# Patient Record
Sex: Male | Born: 1960 | Hispanic: No | Marital: Single | Smoking: Never smoker
Health system: Southern US, Community
[De-identification: ages and names within clinical notes are randomized; demographics above are authoritative.]

## PROBLEM LIST (undated history)

## (undated) DIAGNOSIS — G8929 Other chronic pain: Secondary | ICD-10-CM

## (undated) DIAGNOSIS — R109 Unspecified abdominal pain: Secondary | ICD-10-CM

## (undated) DIAGNOSIS — I10 Essential (primary) hypertension: Secondary | ICD-10-CM

## (undated) DIAGNOSIS — R519 Headache, unspecified: Secondary | ICD-10-CM

## (undated) DIAGNOSIS — M542 Cervicalgia: Secondary | ICD-10-CM

## (undated) DIAGNOSIS — R51 Headache: Secondary | ICD-10-CM

## (undated) HISTORY — PX: TENDON REPAIR: SHX5111

---

## 2000-05-15 ENCOUNTER — Ambulatory Visit (HOSPITAL_COMMUNITY): Admission: RE | Admit: 2000-05-15 | Discharge: 2000-05-15 | Payer: Self-pay | Admitting: General Surgery

## 2000-05-15 ENCOUNTER — Encounter: Payer: Self-pay | Admitting: General Surgery

## 2001-10-28 ENCOUNTER — Ambulatory Visit (HOSPITAL_COMMUNITY): Admission: RE | Admit: 2001-10-28 | Discharge: 2001-10-28 | Payer: Self-pay | Admitting: Internal Medicine

## 2001-10-28 ENCOUNTER — Encounter: Payer: Self-pay | Admitting: Internal Medicine

## 2001-11-06 ENCOUNTER — Ambulatory Visit (HOSPITAL_COMMUNITY): Admission: RE | Admit: 2001-11-06 | Discharge: 2001-11-06 | Payer: Self-pay | Admitting: Internal Medicine

## 2001-11-06 ENCOUNTER — Encounter: Payer: Self-pay | Admitting: Internal Medicine

## 2002-05-16 ENCOUNTER — Emergency Department (HOSPITAL_COMMUNITY): Admission: EM | Admit: 2002-05-16 | Discharge: 2002-05-16 | Payer: Self-pay | Admitting: Emergency Medicine

## 2002-07-21 ENCOUNTER — Ambulatory Visit (HOSPITAL_COMMUNITY): Admission: RE | Admit: 2002-07-21 | Discharge: 2002-07-21 | Payer: Self-pay | Admitting: Family Medicine

## 2002-07-21 ENCOUNTER — Encounter: Payer: Self-pay | Admitting: Family Medicine

## 2002-09-15 ENCOUNTER — Ambulatory Visit (HOSPITAL_COMMUNITY): Admission: RE | Admit: 2002-09-15 | Discharge: 2002-09-15 | Payer: Self-pay | Admitting: Internal Medicine

## 2002-09-15 ENCOUNTER — Encounter: Payer: Self-pay | Admitting: Internal Medicine

## 2012-10-16 ENCOUNTER — Encounter (HOSPITAL_COMMUNITY): Payer: Self-pay

## 2012-10-16 ENCOUNTER — Emergency Department (HOSPITAL_COMMUNITY): Payer: Self-pay

## 2012-10-16 ENCOUNTER — Emergency Department (HOSPITAL_COMMUNITY)
Admission: EM | Admit: 2012-10-16 | Discharge: 2012-10-16 | Disposition: A | Discharge status: Home / Self Care | Payer: Self-pay | Attending: Emergency Medicine | Admitting: Emergency Medicine

## 2012-10-16 DIAGNOSIS — G510 Bell's palsy: Secondary | ICD-10-CM | POA: Insufficient documentation

## 2012-10-16 DIAGNOSIS — Z7982 Long term (current) use of aspirin: Secondary | ICD-10-CM | POA: Insufficient documentation

## 2012-10-16 DIAGNOSIS — G8929 Other chronic pain: Secondary | ICD-10-CM | POA: Insufficient documentation

## 2012-10-16 DIAGNOSIS — R51 Headache: Secondary | ICD-10-CM | POA: Insufficient documentation

## 2012-10-16 HISTORY — DX: Headache, unspecified: R51.9

## 2012-10-16 HISTORY — DX: Unspecified abdominal pain: R10.9

## 2012-10-16 HISTORY — DX: Headache: R51

## 2012-10-16 HISTORY — DX: Other chronic pain: G89.29

## 2012-10-16 HISTORY — DX: Cervicalgia: M54.2

## 2012-10-16 MED ORDER — ACYCLOVIR 400 MG PO TABS: 400.0000 mg | ORAL_TABLET | Freq: Every day | ORAL | Status: DC

## 2012-10-16 MED ORDER — PREDNISONE 20 MG PO TABS: 40.0000 mg | ORAL_TABLET | Freq: Every day | ORAL | Status: DC

## 2012-10-16 MED ORDER — PREDNISONE 50 MG PO TABS
60.0000 mg | ORAL_TABLET | Freq: Once | ORAL | Status: AC
  Administered 2012-10-16: 17:00:00 60 mg via ORAL
  Filled 2012-10-16: qty 1

## 2012-10-16 MED ORDER — ARTIFICIAL TEARS OP OINT: TOPICAL_OINTMENT | OPHTHALMIC | Status: DC | PRN

## 2012-10-16 NOTE — ED Provider Notes (Signed)
CSN: [id number]     Arrival date & time 10/16/12  1453 History   First MD Initiated Contact with Patient 10/16/12 1550     Chief Complaint  Patient presents with  . Numbness  . Headache    HPI Pt was seen at 1600. Per pt, c/o gradual onset and persistence of constant right sided facial "droop" for the past 3 to 4 days. Has been associated with inability to close his right eyelid and food "dropping" out of the right corner of his mouth. Denies any change in symptoms over the past 3 to 4 days. Denies visual changes, no dysphagia, no slurred speech, no focal motor weakness, no tingling/numbness in extremities, no fevers, no rash, no head/neck/face injury, no abd pain, no N/V/D, no CP/SOB.     Past Medical History  Diagnosis Date  . Chronic abdominal pain   . Chronic headaches   . Chronic neck pain    Past Surgical History  Procedure Laterality Date  . Tendon repair      left hand    History  Substance Use Topics  . Smoking status: Never Smoker   . Smokeless tobacco: Not on file  . Alcohol Use: No     Comment: former    Review of Systems ROS: Statement: All systems negative except as marked or noted in the HPI; Constitutional: Negative for fever and chills. ; ; Eyes: Negative for eye pain, redness and discharge. ; ; ENMT: Negative for ear pain, hoarseness, nasal congestion, sinus pressure and sore throat. ; ; Cardiovascular: Negative for chest pain, palpitations, diaphoresis, dyspnea and peripheral edema. ; ; Respiratory: Negative for cough, wheezing and stridor. ; ; Gastrointestinal: Negative for nausea, vomiting, diarrhea, abdominal pain, blood in stool, hematemesis, jaundice and rectal bleeding. . ; ; Genitourinary: Negative for dysuria, flank pain and hematuria. ; ; Musculoskeletal: Negative for back pain and neck pain. Negative for swelling and trauma.; ; Skin: Negative for pruritus, rash, abrasions, blisters, bruising and skin lesion.; ; Neuro: +right facial droop. Negative for  headache, lightheadedness and neck stiffness. Negative for weakness, altered level of consciousness , altered mental status, extremity weakness, paresthesias, involuntary movement, seizure and syncope.       Allergies  Review of patient's allergies indicates no known allergies.  Home Medications   Current Outpatient Rx  Name  Route  Sig  Dispense  Refill  . aspirin EC 81 MG tablet   Oral   Take 81 mg by mouth once.          BP 181/88  Pulse 77  Temp(Src) 97.9 F (36.6 C) (Oral)  Resp 20  Ht 5\' 3"  (1.6 m)  Wt 149 lb (67.586 kg)  BMI 26.4 kg/m2  SpO2 99% Physical Exam 1605: Physical examination:  Nursing notes reviewed; Vital signs and O2 SAT reviewed;  Constitutional: Well developed, Well nourished, Well hydrated, In no acute distress; Head:  Normocephalic, atraumatic; Eyes: EOMI, PERRL, No scleral icterus; ENMT: TM's clear bilat. Mouth and pharynx normal, Mucous membranes moist; Neck: Supple, Full range of motion, No lymphadenopathy; Cardiovascular: Regular rate and rhythm, No murmur, rub, or gallop; Respiratory: Breath sounds clear & equal bilaterally, No rales, rhonchi, wheezes.  Speaking full sentences with ease, Normal respiratory effort/excursion; Chest: Nontender, Movement normal; Abdomen: Soft, Nontender, Nondistended, Normal bowel sounds; Genitourinary: No CVA tenderness; Extremities: Pulses normal, No tenderness, No edema, No calf edema or asymmetry.; Neuro: AA&Ox3, Major CN grossly intact.  Strength 5/5 equal bilat UE's and LE's.  DTR 2/4 equal bilat UE's  and LE's.  No gross sensory deficits.  Normal cerebellar testing bilat UE's (finger-nose) and LE's (heel-shin). Speech clear. +entire right sided facial droop which includes right forehead.  No nystagmus. Climbs on and off stretcher by himself. Gait steady.;; Skin: Color normal, Warm, Dry.   ED Course  Procedures     MDM  MDM Reviewed: previous chart, nursing note and vitals Reviewed previous: CT scan and  MRI Interpretation: CT scan and ECG     Date: 10/16/2012  Rate: 80  Rhythm: normal sinus rhythm  QRS Axis: normal  Intervals: normal  ST/T Wave abnormalities: normal  Conduction Disutrbances:none  Narrative Interpretation:   Old EKG Reviewed: none available.   Ct Head Wo Contrast 10/16/2012   CLINICAL DATA:  The three-day headache with 2 day history of right facial droop  EXAM: CT HEAD WITHOUT CONTRAST  TECHNIQUE: Contiguous axial images were obtained from the base of the skull through the vertex without intravenous contrast. Study was obtained within 24 hr of patient's arrival at the emergency department.  COMPARISON:  None.  FINDINGS: The ventricles are normal in size and configuration. There is no mass, hemorrhage, extra-axial fluid collection, or midline shift. Gray-white compartments are normal. There is no demonstrable acute infarct.  Bony calvarium appears intact. The mastoid air cells are clear.  IMPRESSION: Study within normal limits.   Electronically Signed   By: Bretta Bang   On: 10/16/2012 16:23    1635:  CT negative for CVA after 3 to 4 days of constant symptoms. Wll tx for Bells palsy. Dx and testing d/w pt and family.  Questions answered.  Verb understanding, agreeable to d/c home with outpt f/u.       Laray Anger, DO 10/18/12 1252

## 2012-10-16 NOTE — ED Notes (Signed)
No other neuro deficits noted.  Pupils PERRLA.

## 2012-10-16 NOTE — ED Notes (Signed)
Pt reports headache for 3 days, right facial droop for 3 days. No slurred speech. No difficulty walking or gripping.

## 2013-11-04 ENCOUNTER — Encounter (HOSPITAL_COMMUNITY): Payer: Self-pay | Admitting: Emergency Medicine

## 2013-11-04 ENCOUNTER — Observation Stay (HOSPITAL_COMMUNITY)
Admission: EM | Admit: 2013-11-04 | Discharge: 2013-11-06 | Disposition: A | Discharge status: Home / Self Care | Payer: MEDICAID | Attending: Internal Medicine | Admitting: Internal Medicine

## 2013-11-04 ENCOUNTER — Emergency Department (HOSPITAL_COMMUNITY): Payer: MEDICAID

## 2013-11-04 DIAGNOSIS — R739 Hyperglycemia, unspecified: Secondary | ICD-10-CM

## 2013-11-04 DIAGNOSIS — R030 Elevated blood-pressure reading, without diagnosis of hypertension: Secondary | ICD-10-CM

## 2013-11-04 DIAGNOSIS — IMO0001 Reserved for inherently not codable concepts without codable children: Secondary | ICD-10-CM | POA: Diagnosis present

## 2013-11-04 DIAGNOSIS — M542 Cervicalgia: Secondary | ICD-10-CM | POA: Insufficient documentation

## 2013-11-04 DIAGNOSIS — E785 Hyperlipidemia, unspecified: Secondary | ICD-10-CM

## 2013-11-04 DIAGNOSIS — E119 Type 2 diabetes mellitus without complications: Secondary | ICD-10-CM

## 2013-11-04 DIAGNOSIS — E1165 Type 2 diabetes mellitus with hyperglycemia: Secondary | ICD-10-CM | POA: Insufficient documentation

## 2013-11-04 DIAGNOSIS — G459 Transient cerebral ischemic attack, unspecified: Principal | ICD-10-CM | POA: Diagnosis present

## 2013-11-04 DIAGNOSIS — G8929 Other chronic pain: Secondary | ICD-10-CM | POA: Insufficient documentation

## 2013-11-04 HISTORY — DX: Essential (primary) hypertension: I10

## 2013-11-04 LAB — COMPREHENSIVE METABOLIC PANEL
ALK PHOS: 106 U/L (ref 39–117)
ALT: 35 U/L (ref 0–53)
AST: 25 U/L (ref 0–37)
Albumin: 4.2 g/dL (ref 3.5–5.2)
Anion gap: 13 (ref 5–15)
BUN: 18 mg/dL (ref 6–23)
CO2: 27 meq/L (ref 19–32)
Calcium: 9.7 mg/dL (ref 8.4–10.5)
Chloride: 98 mEq/L (ref 96–112)
Creatinine, Ser: 0.77 mg/dL (ref 0.50–1.35)
GFR calc non Af Amer: >90 mL/min (ref >90)
GLUCOSE: 288 mg/dL — AB (ref 70–99)
POTASSIUM: 4.2 meq/L (ref 3.7–5.3)
SODIUM: 138 meq/L (ref 137–147)
TOTAL PROTEIN: 8.2 g/dL (ref 6.0–8.3)
Total Bilirubin: 0.8 mg/dL (ref 0.3–1.2)

## 2013-11-04 LAB — CBC WITH DIFFERENTIAL/PLATELET
Basophils Absolute: 0.1 10*3/uL (ref 0.0–0.1)
Basophils Relative: 1 % (ref 0–1)
EOS ABS: 0.2 10*3/uL (ref 0.0–0.7)
Eosinophils Relative: 2 % (ref 0–5)
HCT: 46.1 % (ref 39.0–52.0)
Hemoglobin: 16.4 g/dL (ref 13.0–17.0)
LYMPHS ABS: 2.2 10*3/uL (ref 0.7–4.0)
LYMPHS PCT: 26 % (ref 12–46)
MCH: 31.5 pg (ref 26.0–34.0)
MCHC: 35.6 g/dL (ref 30.0–36.0)
MCV: 88.5 fL (ref 78.0–100.0)
Monocytes Absolute: 0.5 10*3/uL (ref 0.1–1.0)
Monocytes Relative: 6 % (ref 3–12)
NEUTROS PCT: 65 % (ref 43–77)
Neutro Abs: 5.6 10*3/uL (ref 1.7–7.7)
PLATELETS: 290 10*3/uL (ref 150–400)
RBC: 5.21 MIL/uL (ref 4.22–5.81)
RDW: 12.3 % (ref 11.5–15.5)
WBC: 8.5 10*3/uL (ref 4.0–10.5)

## 2013-11-04 LAB — ETHANOL: Alcohol, Ethyl (B): <11 mg/dL (ref 0–11)

## 2013-11-04 NOTE — ED Notes (Signed)
Numbness that started today, first noticed it in my fingers on my left hand now it is in my left arm and going down my left leg per pt. Patient is also having pain in the back of his head.

## 2013-11-04 NOTE — ED Provider Notes (Addendum)
CSN: [id number]     Arrival date & time 11/04/13  2054 History  This chart was scribed for Flint MelterElliott L Kathleen Likins, MD by Bronson CurbJacqueline Melvin, ED Scribe. This patient was seen in room APA04/APA04 and the patient's care was started at 11:24 PM.    Chief Complaint  Patient presents with  . Numbness     The history is provided by the patient. No language interpreter was used.    HPI Comments: Jeff Powers is a 53 y.o. male who presents to the Emergency Department complaining of intermittent numbness to left arm and all fingers of his left hand onset this morning. 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 Patient reports a total of 2 episodes today, but was able to continue working throughout the day. He states that the first (around 10 AM) episode lasted for 5 minutes, before returning  several hours later (around 5 PM), and lasting for 5-7 minutes. There is associated neck pain. Patient also notes some numbness to his left leg, also intermittent, same time course as left hand. He denies HA, nausea, vomiting, dizziness, and weakness. Patient is currently not on BP medication at this time. There are no other known modifying factors.   Past Medical History  Diagnosis Date  . Chronic abdominal pain   . Chronic headaches   . Chronic neck pain    Past Surgical History  Procedure Laterality Date  . Tendon repair      left hand   History reviewed. No pertinent family history. History  Substance Use Topics  . Smoking status: Never Smoker   . Smokeless tobacco: Not on file  . Alcohol Use: No     Comment: former    Review of Systems  Musculoskeletal: Positive for neck pain.  Neurological: Positive for numbness (fingers of left hand, left arm, left leg).  All other systems reviewed and are negative.     Allergies  Review of patient's allergies indicates no known allergies.  Home Medications   Prior to Admission medications   Not on File   Triage Vitals: BP  177/90  Pulse 84  Temp(Src) 98.2 F (36.8 C) (Oral)  Resp 18  Ht 5\' 3"  (1.6 m)  Wt 148 lb (67.132 kg)  BMI 26.22 kg/m2  SpO2 96%  Physical Exam  Nursing note and vitals reviewed. Constitutional: He is oriented to person, place, and time. He appears well-developed and well-nourished.  HENT:  Head: Normocephalic and atraumatic.  Right Ear: External ear normal.  Left Ear: External ear normal.  Eyes: Conjunctivae and EOM are normal. Pupils are equal, round, and reactive to light.  Neck: Normal range of motion and phonation normal. Neck supple.  Cardiovascular: Normal rate, regular rhythm and normal heart sounds.   Pulmonary/Chest: Effort normal and breath sounds normal. He exhibits no bony tenderness.  Abdominal: Soft. There is no tenderness.  Musculoskeletal: Normal range of motion.  Neurological: He is alert and oriented to person, place, and time. No cranial nerve deficit or sensory deficit. He exhibits normal muscle tone. Coordination normal.  No pronator drift. No nystagmus, dysphasia, or dysarthria. No altered light touch sensation in the hands, arms or legs  Skin: Skin is warm, dry and intact.  Psychiatric: He has a normal mood and affect. His behavior is normal. Judgment and thought content normal.    ED Course  Procedures (including critical care time)  23:00- consideration for TPA; there is no indication to give thrombolytics at this time because symptoms  completely resolved, shortly after the last episode at 5 PM  DIAGNOSTIC STUDIES: Oxygen Saturation is 96% on room air, adequate by my interpretation.    COORDINATION OF CARE: At 2330 Discussed treatment plan with patient which includes labs. Patient agrees.   0100- consultation with neurology for evaluation of possible TIA. Will be seen by [facility] via telemetry, routinely.  04:00- Discussed with [facility] Neurology, recommends evaluation with MRI and w/u for TIA.  4:10 AM-Consult complete with Dr. Rito Ehrlich. Patient case  explained and discussed. He agrees to admit patient for further evaluation and treatment. Call ended at 0410  Labs Review Labs Reviewed  COMPREHENSIVE METABOLIC PANEL - Abnormal; Notable for the following:    Glucose, Bld 288 (*)    All other components within normal limits  URINALYSIS, ROUTINE W REFLEX MICROSCOPIC - Abnormal; Notable for the following:    Glucose, UA >1000 (*)    Ketones, ur TRACE (*)    Urobilinogen, UA 4.0 (*)    All other components within normal limits  CBC WITH DIFFERENTIAL  ETHANOL  PROTIME-INR  APTT  URINE RAPID DRUG SCREEN (HOSP PERFORMED)  URINE MICROSCOPIC-ADD ON    Imaging Review Ct Head Wo Contrast  11/04/2013   CLINICAL DATA:  Numbness which began earlier today in the fingers of the left hand, then advanced up the left arm and now involving the left leg also. Occipital headache.  EXAM: CT HEAD WITHOUT CONTRAST  TECHNIQUE: Contiguous axial images were obtained from the base of the skull through the vertex without intravenous contrast.  COMPARISON:  10/16/2012.  FINDINGS: Ventricular system normal in size and appearance for age. No mass lesion. No midline shift. No acute hemorrhage or hematoma. No extra-axial fluid collections. No evidence of acute infarction. No focal brain parenchymal abnormalities. No significant interval change.  No focal osseous abnormalities involving the skull. Visualized paranasal sinuses, bilateral mastoid air cells, and bilateral middle ear cavities well-aerated.  IMPRESSION: Normal and stable examination.   Electronically Signed   By: Hulan Saas M.D.   On: 11/04/2013 22:04     EKG Interpretation   Date/Time:  Tuesday November 04 2013 23:46:04 EDT Ventricular Rate:  69 PR Interval:  176 QRS Duration: 86 QT Interval:  380 QTC Calculation: 407 R Axis:   31 Text Interpretation:  Sinus rhythm ST elev, probable normal early repol  pattern since last tracing no significant change Confirmed by [facility]  MD,  Dreama Kuna ([id number]) on  11/05/2013 12:52:48 AM      MDM   Final diagnoses:  Transient cerebral ischemia, unspecified transient cerebral ischemia type  Hyperglycemia   Nonspecific paresthesias, intermittent, associated with mild, hypertension, untreated. Blood pressure improved spontaneously. No history similar symptoms. Incidental hyperglycemia on screening evaluation. No evidence of acute cardiac abnormality. Differential diagnosis includes peripheral neuropathy, TIA, hypertension; as possible sources of the discomfort.  Nursing Notes Reviewed/ Care Coordinated, and agree without changes. Applicable Imaging Reviewed.  Interpretation of Laboratory Data incorporated into ED treatment  Plan: Admit    I personally performed the services described in this documentation, which was scribed in my presence. The recorded information has been reviewed and is accurate.    Flint Melter, MD 11/05/13 0425  Flint Melter, MD 11/05/13 [id number]

## 2013-11-04 NOTE — ED Notes (Signed)
Pt reports that this morning he began feeling tingling in last two fingers on left hand.  Also c/o mild pain in left side of neck.

## 2013-11-05 ENCOUNTER — Observation Stay (HOSPITAL_COMMUNITY): Payer: MEDICAID

## 2013-11-05 ENCOUNTER — Observation Stay (HOSPITAL_COMMUNITY)
Admission: EM | Admit: 2013-11-05 | Discharge: 2013-11-05 | Disposition: A | Discharge status: Home / Self Care | Payer: MEDICAID | Source: Home / Self Care | Attending: Internal Medicine | Admitting: Internal Medicine

## 2013-11-05 ENCOUNTER — Encounter (HOSPITAL_COMMUNITY): Payer: Self-pay | Admitting: Internal Medicine

## 2013-11-05 DIAGNOSIS — IMO0001 Reserved for inherently not codable concepts without codable children: Secondary | ICD-10-CM | POA: Diagnosis present

## 2013-11-05 DIAGNOSIS — G459 Transient cerebral ischemic attack, unspecified: Secondary | ICD-10-CM | POA: Diagnosis present

## 2013-11-05 DIAGNOSIS — R739 Hyperglycemia, unspecified: Secondary | ICD-10-CM

## 2013-11-05 DIAGNOSIS — I517 Cardiomegaly: Secondary | ICD-10-CM

## 2013-11-05 DIAGNOSIS — R030 Elevated blood-pressure reading, without diagnosis of hypertension: Secondary | ICD-10-CM

## 2013-11-05 LAB — GLUCOSE, CAPILLARY
GLUCOSE-CAPILLARY: 221 mg/dL — AB (ref 70–99)
Glucose-Capillary: 169 mg/dL — ABNORMAL HIGH (ref 70–99)
Glucose-Capillary: 197 mg/dL — ABNORMAL HIGH (ref 70–99)
Glucose-Capillary: 267 mg/dL — ABNORMAL HIGH (ref 70–99)

## 2013-11-05 LAB — PROTIME-INR
INR: 1.02 (ref 0.00–1.49)
Prothrombin Time: 13.4 seconds (ref 11.6–15.2)

## 2013-11-05 LAB — URINALYSIS, ROUTINE W REFLEX MICROSCOPIC
BILIRUBIN URINE: NEGATIVE
HGB URINE DIPSTICK: NEGATIVE
Leukocytes, UA: NEGATIVE
Nitrite: NEGATIVE
PH: 7 (ref 5.0–8.0)
Protein, ur: NEGATIVE mg/dL
Specific Gravity, Urine: 1.015 (ref 1.005–1.030)
Urobilinogen, UA: 4 mg/dL — ABNORMAL HIGH (ref 0.0–1.0)

## 2013-11-05 LAB — LIPID PANEL
CHOL/HDL RATIO: 3.7 ratio
Cholesterol: 185 mg/dL (ref 0–200)
HDL: 50 mg/dL (ref >39)
LDL Cholesterol: 106 mg/dL — ABNORMAL HIGH (ref 0–99)
TRIGLYCERIDES: 147 mg/dL (ref <150)
VLDL: 29 mg/dL (ref 0–40)

## 2013-11-05 LAB — HEMOGLOBIN A1C
Hgb A1c MFr Bld: 8.6 % — ABNORMAL HIGH (ref <5.7)
Mean Plasma Glucose: 200 mg/dL — ABNORMAL HIGH (ref <117)

## 2013-11-05 LAB — APTT: aPTT: 30 seconds (ref 24–37)

## 2013-11-05 LAB — RAPID URINE DRUG SCREEN, HOSP PERFORMED
Amphetamines: NOT DETECTED
BARBITURATES: NOT DETECTED
BENZODIAZEPINES: NOT DETECTED
COCAINE: NOT DETECTED
OPIATES: NOT DETECTED
Tetrahydrocannabinol: NOT DETECTED

## 2013-11-05 LAB — MRSA PCR SCREENING: MRSA by PCR: INVALID — AB

## 2013-11-05 LAB — URINE MICROSCOPIC-ADD ON: Urine-Other: NONE SEEN

## 2013-11-05 MED ORDER — STROKE: EARLY STAGES OF RECOVERY BOOK
Freq: Once | Status: AC
  Administered 2013-11-05: 10:00:00
  Filled 2013-11-05: qty 1

## 2013-11-05 MED ORDER — ASPIRIN 325 MG PO TABS
325.0000 mg | ORAL_TABLET | Freq: Every day | ORAL | Status: DC
  Administered 2013-11-05 – 2013-11-06 (×2): 325 mg via ORAL
  Filled 2013-11-05 (×2): qty 1

## 2013-11-05 MED ORDER — ACETAMINOPHEN 325 MG PO TABS: 650.0000 mg | ORAL_TABLET | ORAL | Status: DC | PRN

## 2013-11-05 MED ORDER — SIMVASTATIN 20 MG PO TABS
20.0000 mg | ORAL_TABLET | Freq: Every day | ORAL | Status: DC
  Administered 2013-11-05: 20 mg via ORAL
  Filled 2013-11-05: qty 1

## 2013-11-05 MED ORDER — SODIUM CHLORIDE 0.9 % IJ SOLN: 3.0000 mL | INTRAMUSCULAR | Status: DC | PRN

## 2013-11-05 MED ORDER — INSULIN ASPART 100 UNIT/ML ~~LOC~~ SOLN
0.0000 [IU] | Freq: Three times a day (TID) | SUBCUTANEOUS | Status: DC
  Administered 2013-11-05: 2 [IU] via SUBCUTANEOUS
  Administered 2013-11-06 (×2): 3 [IU] via SUBCUTANEOUS

## 2013-11-05 MED ORDER — ONDANSETRON HCL 4 MG/2ML IJ SOLN
4.0000 mg | Freq: Four times a day (QID) | INTRAMUSCULAR | Status: DC | PRN
Start: 2013-11-05 — End: 2013-11-06

## 2013-11-05 MED ORDER — ENOXAPARIN SODIUM 40 MG/0.4ML ~~LOC~~ SOLN
40.0000 mg | SUBCUTANEOUS | Status: DC
  Administered 2013-11-05 – 2013-11-06 (×2): 40 mg via SUBCUTANEOUS
  Filled 2013-11-05 (×2): qty 0.4

## 2013-11-05 MED ORDER — SODIUM CHLORIDE 0.9 % IJ SOLN
3.0000 mL | Freq: Two times a day (BID) | INTRAMUSCULAR | Status: DC
  Administered 2013-11-05 (×2): 3 mL via INTRAVENOUS

## 2013-11-05 MED ORDER — SODIUM CHLORIDE 0.9 % IV SOLN: 250.0000 mL | INTRAVENOUS | Status: DC | PRN

## 2013-11-05 NOTE — Progress Notes (Signed)
   [facility]                                                                              Patient Demographics  Jeff Powers, is a 53 y.o. male, DOB - 04/19/1960, ZOX:[id number]  Admit date - 11/04/2013   Admitting Physician Osvaldo ShipperGokul Krishnan, MD  Outpatient Primary MD for the patient is No PCP Per Patient  LOS - 1   Chief Complaint  Patient presents with  . Numbness      HPI on 11/05/2013 Jeff Powers is a [age] y.o. 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 At 9:00 am 11/04/2013 he experienced some tingling in the left arm starting in the fingers extending up to the shoulder area. It lasted a few minutes and then resolved. And, then this recurred again, at 5 PM and this time he also had numbness in his left leg. Unclear if he was weak on that side. Denied any speech impairment. No difficulty swallowing. No seizure activity. No visual impairment. No headaches. He did have some neck pain, earlier yesterday. He had Bell's palsy last year. He stated that he has never had a stroke. He has not taken any blood pressure medicine in many years despite being diagnosed [place] many years [place]. Since the symptoms recurred he decided to seek attention.   Assessment & Plan   Patient was admitted early this morning, agree with current assessment and plan done by Dr. Osvaldo ShipperGokul Krishnan.  Left-sided numbness and tingling, evaluate for TIA -CT of the head: Normal and stable examination -Pending MRI of the brain as well as MRI of the head, MRI of the cervical spine, echocardiogram, carotid Dopplers, hemoglobin A1c -LDL is 106 -PT and OT have also been consulted for evaluation and treatment -Patient currently is not having any symptoms of numbness or tingling -Will place patient on aspirin start him on low dose statin  Hyperglycemia -Patient does not have a  history of diabetes, pending hemoglobin A1c -Will place him on insulin sliding scale with CBG monitoring -May start him on metformin pending his hemoglobin A1c  Hypertension -Patient states he does not take any medications, however did this several years ago -Will of a permissive hypertension however patient will likely need to be discharged with antihypertensive is  Code Status: Full  Family Communication: Friend at bedside  Disposition Plan: Admitted for observation, pending TIA workup  Time Spent in minutes   30 minutes  Procedures  None  Consults   None  DVT Prophylaxis  Lovenox  Dorthy Hustead D.O. on 11/05/2013 at 10:39 AM  Between 7am to 7pm - Pager - [phone]  After 7pm go to [email or web address] - password [email or web address]  And look for the night coverage person covering for me after hours  [facility] Office  [phone]

## 2013-11-05 NOTE — Progress Notes (Signed)
Inpatient Diabetes Program Recommendations  AACE/ADA: New Consensus Statement on Inpatient Glycemic Control (2013)  Target Ranges:  Prepandial:   less than 140 mg/dL      Peak postprandial:   less than 180 mg/dL (1-2 hours)      Critically ill patients:  140 - 180 mg/dL   Results for [name], Jeff Powers (MRN [id number]) as of 11/05/2013 07:33  Ref. Range 11/04/2013 21:20  Glucose Latest Range: 70-99 mg/dL 409288 (H)   Diabetes history: NO Outpatient Diabetes medications: NA Current orders for Inpatient glycemic control: None  Inpatient Diabetes Program Recommendations Correction (SSI): Please consider ordering CBGs with Novolog correction if needed.  Noted A1C has been ordered an is in process.   Thanks, Orlando PennerMarie Adriaan Maltese, RN, MSN, CCRN Diabetes Coordinator Inpatient Diabetes Program [phone] (Team Pager) [phone] ([facility] office) [phone] [facility] office)

## 2013-11-05 NOTE — Progress Notes (Signed)
OT Cancellation Note  Patient Details Name: Jeff Powers MRN: [id number] DOB: 04/20/1960   Cancelled Treatment:    Reason Eval/Treat Not Completed: OT screened, no needs identified, will sign off.  Pt has WFL strength, ROM, and sensation. Pt has no concerns about symptoms or ADLs at home at d/c.  Pt does not need OT services at this time.    Jeff GuanMarie Rawlings Kamari Bilek, MS, OTR/L [facility] [phone] 11/05/2013, 8:40 AM

## 2013-11-05 NOTE — H&P (Signed)
[facility] History and Physical  [name] KDX:[id number] DOB: 1960/09/06 DOA: 11/04/2013   PCP: He states that he sees a physician across the street from this hospital, but is unable to name this provider Specialists: None  Chief Complaint: Tingling, numbness in the left side  HPI: Jeff Powers is a 53 y.o. 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 At 9:00 yesterday morning he experienced some tingling in the left arm starting in the fingers extending up to the shoulder area. It lasted a few minutes and then resolved. And, then this recurred again, at 5 PM and this time he also had numbness in his left leg. Unclear if he was weak on that side. Denies any speech impairment. No difficulty swallowing. No seizure activity. No visual impairment. No headaches. He did have some neck pain, earlier yesterday. He had Bell's palsy last year. He states that he has never had a stroke. Hasn't taken any blood pressure medicine in many years despite being diagnosed with Hypertension many years ago in [place]. Since the symptoms recurred he decided to seek attention.  Home Medications: Prior to Admission medications   Not on File    Allergies: No Known Allergies  Past Medical History: Past Medical History  Diagnosis Date  . Chronic abdominal pain   . Chronic headaches   . Chronic neck pain     Past Surgical History  Procedure Laterality Date  . Tendon repair      left hand    Social History: He lives in [place]. No smoking, alcohol use or illicit drug use. 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  Family History:  he denies any health problems in his family  Review of Systems - History obtained from the patient General ROS: negative Psychological ROS: negative Ophthalmic ROS: negative ENT ROS: negative Allergy  and Immunology ROS: negative Hematological and Lymphatic ROS: negative Endocrine ROS: negative Respiratory ROS: no cough, shortness of breath, or wheezing Cardiovascular ROS: no chest pain or dyspnea on exertion Gastrointestinal ROS: no abdominal pain, change in bowel habits, or black or bloody stools Genito-Urinary ROS: no dysuria, trouble voiding, or hematuria Musculoskeletal ROS: negative Neurological ROS: as in hpi Dermatological ROS: negative  Physical Examination  Filed Vitals:   11/04/13 2106 11/04/13 2258 11/05/13 0409  BP: 177/90 144/85 149/81  Pulse: 84 68 57  Temp: 98.2 F (36.8 C) 97.7 F (36.5 C) 97.8 F (36.6 C)  TempSrc: Oral Oral Oral  Resp: 18 18 16   Height: 5' 3"  (1.6 m)    Weight: 67.132 kg (148 lb)    SpO2: 96% 97% 97%    BP 149/81  Pulse 57  Temp(Src) 97.8 F (36.6 C) (Oral)  Resp 16  Ht 5' 3"  (1.6 m)  Wt 67.132 kg (148 lb)  BMI 26.22 kg/m2  SpO2 97%  General appearance: alert, cooperative, appears stated age and no distress Head: Normocephalic, without obvious abnormality, atraumatic Eyes: conjunctivae/corneas clear. PERRL, EOM's intact. Throat: lips, mucosa, and tongue normal; teeth and gums normal Neck: no adenopathy, no carotid bruit, no JVD, supple, symmetrical, trachea midline and thyroid not enlarged, symmetric, no tenderness/mass/nodules Resp: clear to auscultation bilaterally Cardio: regular rate and rhythm, S1, S2 normal, no murmur, click, rub or gallop GI: soft, non-tender; bowel sounds normal; no masses,  no organomegaly Extremities: extremities normal, atraumatic, no cyanosis or edema  Pulses: 2+ and symmetric Skin: Skin color, texture, turgor normal. No rashes or lesions Lymph nodes: Cervical, supraclavicular, and axillary nodes normal. Neurologic: Alert and oriented X 3, normal strength and tone. Normal symmetric reflexes. Normal coordination  Laboratory Data: Results for orders placed during the hospital encounter of 11/04/13  (from the past 48 hour(s))  CBC WITH DIFFERENTIAL     Status: None   Collection Time    11/04/13  9:20 PM      Result Value Ref Range   WBC 8.5  4.0 - 10.5 K/uL   RBC 5.21  4.22 - 5.81 MIL/uL   Hemoglobin 16.4  13.0 - 17.0 g/dL   HCT 46.1  39.0 - 52.0 %   MCV 88.5  78.0 - 100.0 fL   MCH 31.5  26.0 - 34.0 pg   MCHC 35.6  30.0 - 36.0 g/dL   RDW 12.3  11.5 - 15.5 %   Platelets 290  150 - 400 K/uL   Neutrophils Relative % 65  43 - 77 %   Neutro Abs 5.6  1.7 - 7.7 K/uL   Lymphocytes Relative 26  12 - 46 %   Lymphs Abs 2.2  0.7 - 4.0 K/uL   Monocytes Relative 6  3 - 12 %   Monocytes Absolute 0.5  0.1 - 1.0 K/uL   Eosinophils Relative 2  0 - 5 %   Eosinophils Absolute 0.2  0.0 - 0.7 K/uL   Basophils Relative 1  0 - 1 %   Basophils Absolute 0.1  0.0 - 0.1 K/uL  COMPREHENSIVE METABOLIC PANEL     Status: Abnormal   Collection Time    11/04/13  9:20 PM      Result Value Ref Range   Sodium 138  137 - 147 mEq/L   Potassium 4.2  3.7 - 5.3 mEq/L   Chloride 98  96 - 112 mEq/L   CO2 27  19 - 32 mEq/L   Glucose, Bld 288 (*) 70 - 99 mg/dL   BUN 18  6 - 23 mg/dL   Creatinine, Ser 0.77  0.50 - 1.35 mg/dL   Calcium 9.7  8.4 - 10.5 mg/dL   Total Protein 8.2  6.0 - 8.3 g/dL   Albumin 4.2  3.5 - 5.2 g/dL   AST 25  0 - 37 U/L   ALT 35  0 - 53 U/L   Alkaline Phosphatase 106  39 - 117 U/L   Total Bilirubin 0.8  0.3 - 1.2 mg/dL   GFR calc non Af Amer >90  >90 mL/min   GFR calc Af Amer >90  >90 mL/min   Comment: (NOTE)     The eGFR has been calculated using the CKD EPI equation.     This calculation has not been validated in all clinical situations.     eGFR's persistently <90 mL/min signify possible Chronic Kidney     Disease.   Anion gap 13  5 - 15  ETHANOL     Status: None   Collection Time    11/04/13  9:20 PM      Result Value Ref Range   Alcohol, Ethyl (B) <11  0 - 11 mg/dL   Comment:            LOWEST DETECTABLE LIMIT FOR     SERUM ALCOHOL IS 11 mg/dL     FOR MEDICAL PURPOSES ONLY    PROTIME-INR     Status: None   Collection Time    11/04/13  9:20 PM      Result Value Ref Range   Prothrombin Time 13.4  11.6 - 15.2 seconds   INR 1.02  0.00 - 1.49  APTT     Status: None   Collection Time    11/04/13  9:20 PM      Result Value Ref Range   aPTT 30  24 - 37 seconds  URINE RAPID DRUG SCREEN (HOSP PERFORMED)     Status: None   Collection Time    11/04/13 11:50 PM      Result Value Ref Range   Opiates NONE DETECTED  NONE DETECTED   Cocaine NONE DETECTED  NONE DETECTED   Benzodiazepines NONE DETECTED  NONE DETECTED   Amphetamines NONE DETECTED  NONE DETECTED   Tetrahydrocannabinol NONE DETECTED  NONE DETECTED   Barbiturates NONE DETECTED  NONE DETECTED   Comment:            DRUG SCREEN FOR MEDICAL PURPOSES     ONLY.  IF CONFIRMATION IS NEEDED     FOR ANY PURPOSE, NOTIFY LAB     WITHIN 5 DAYS.                LOWEST DETECTABLE LIMITS     FOR URINE DRUG SCREEN     Drug Class       Cutoff (ng/mL)     Amphetamine      1000     Barbiturate      200     Benzodiazepine   646     Tricyclics       803     Opiates          300     Cocaine          300     THC              50  URINALYSIS, ROUTINE W REFLEX MICROSCOPIC     Status: Abnormal   Collection Time    11/04/13 11:50 PM      Result Value Ref Range   Color, Urine YELLOW  YELLOW   APPearance CLEAR  CLEAR   Specific Gravity, Urine 1.015  1.005 - 1.030   pH 7.0  5.0 - 8.0   Glucose, UA >1000 (*) NEGATIVE mg/dL   Hgb urine dipstick NEGATIVE  NEGATIVE   Bilirubin Urine NEGATIVE  NEGATIVE   Ketones, ur TRACE (*) NEGATIVE mg/dL   Protein, ur NEGATIVE  NEGATIVE mg/dL   Urobilinogen, UA 4.0 (*) 0.0 - 1.0 mg/dL   Nitrite NEGATIVE  NEGATIVE   Leukocytes, UA NEGATIVE  NEGATIVE  URINE MICROSCOPIC-ADD ON     Status: None   Collection Time    11/04/13 11:50 PM      Result Value Ref Range   Urine-Other       Value: NO FORMED ELEMENTS SEEN ON URINE MICROSCOPIC EXAMINATION    Radiology Reports: Ct Head Wo  Contrast  11/04/2013   CLINICAL DATA:  Numbness which began earlier today in the fingers of the left hand, then advanced up the left arm and now involving the left leg also. Occipital headache.  EXAM: CT HEAD WITHOUT CONTRAST  TECHNIQUE: Contiguous axial images were obtained from the base of the skull through the vertex without intravenous contrast.  COMPARISON:  10/16/2012.  FINDINGS: Ventricular system normal in size and appearance for age. No mass lesion. No midline shift. No acute hemorrhage or hematoma. No extra-axial fluid collections. No evidence of acute infarction. No focal brain  parenchymal abnormalities. No significant interval change.  No focal osseous abnormalities involving the skull. Visualized paranasal sinuses, bilateral mastoid air cells, and bilateral middle ear cavities well-aerated.  IMPRESSION: Normal and stable examination.   Electronically Signed   By: Evangeline Dakin M.D.   On: 11/04/2013 22:04    Electrocardiogram: Sinus rhythm at 69 beats per minute. Normal axis. Intervals are normal. No concerning ST or T-wave changes are noted.  Problem List  Principal Problem:   TIA (transient ischemic attack) Active Problems:   Hyperglycemia   Elevated blood pressure   Assessment: This is a 53 year old, Hispanic male, who presents with left-sided numbness, tingling, which resolved after a few minutes. Concern is about a transient ischemic attack. Cervical radiculopathy could be another differential. Epileptic activity is another differential.  Plan: #1 left-sided tingling and numbness/TIA: He has been seen by Teleneurology, and they are recommending TIA workup as per the ED physician. We will observe him in the hospital. We will order MRI of the brain, MRA head. Will also get MRI of the cervical spine. Place him on aspirin. PT/OT will be consulted. Lipid panel will be checked. Echocardiogram and carotid Dopplers will be obtained.  #2 hyperglycemia: Check HbA1c in the morning and a  fasting level. He likely has diabetes. Definitive management will need to be discussed with him. Metformin could be a good choice.  #3 elevated blood pressure with a previous history of hypertension: Patient does not take any blood pressure medications at home any longer. Monitor blood pressure closely. Consider antihypertensive agent if blood pressure remains elevated.  DVT Prophylaxis: Lovenox Code Status: Full code Family Communication: Discussed with the patient  Disposition Plan: Observe the telemetry   Further management decisions will depend on results of further testing and patient's response to treatment.   [facility]  [facility] Pager [phone]  If 7PM-7AM, please contact night-coverage [email or web address] Password [email or web address]  11/05/2013, 4:25 AM

## 2013-11-05 NOTE — Progress Notes (Signed)
  Echocardiogram 2D Echocardiogram has been performed.  Jeff Powers 11/05/2013, 9:22 AM

## 2013-11-05 NOTE — Progress Notes (Signed)
Called report to Darvin Neighbourshristina Knight, RN.  Verbalized understanding.  Pt transferred to rm 320 in safe and stable condition. Schonewitz, Candelaria StagersLeigh Anne 11/05/2013

## 2013-11-05 NOTE — Progress Notes (Signed)
EEG Completed; Results Pending  

## 2013-11-05 NOTE — ED Notes (Signed)
Teleneurology consult completed  

## 2013-11-05 NOTE — Progress Notes (Signed)
PT Cancellation Note  Patient Details Name: Jeff AlmondCiriaco T Canizalez MRN: [id number] DOB: 04/04/1960   Cancelled Treatment:    Reason Eval/Treat Not Completed: PT screened, no needs identified, will sign off  Received order and chart reviewed.  Per RN, [facility] feeding self and not displaying any difficulties with functional mobility skills.  Pt screened and (I) with bed mobility skills, transfers, and amb skills in room.  No needs identified at this time; pt to be d/c from acute PT services.    Chanae Gemma 11/05/2013, 8:40 AM

## 2013-11-06 DIAGNOSIS — E785 Hyperlipidemia, unspecified: Secondary | ICD-10-CM

## 2013-11-06 DIAGNOSIS — M542 Cervicalgia: Secondary | ICD-10-CM

## 2013-11-06 DIAGNOSIS — E119 Type 2 diabetes mellitus without complications: Secondary | ICD-10-CM

## 2013-11-06 LAB — GLUCOSE, CAPILLARY
GLUCOSE-CAPILLARY: 233 mg/dL — AB (ref 70–99)
Glucose-Capillary: 206 mg/dL — ABNORMAL HIGH (ref 70–99)

## 2013-11-06 MED ORDER — LIVING WELL WITH DIABETES BOOK - IN SPANISH
Freq: Once | Status: AC
  Administered 2013-11-06: 10:00:00
  Filled 2013-11-06: qty 1

## 2013-11-06 MED ORDER — METFORMIN HCL 500 MG PO TABS: 500.0000 mg | ORAL_TABLET | Freq: Two times a day (BID) | ORAL | Status: AC

## 2013-11-06 MED ORDER — ASPIRIN EC 81 MG PO TBEC: 81.0000 mg | DELAYED_RELEASE_TABLET | Freq: Every day | ORAL | Status: AC

## 2013-11-06 MED ORDER — SIMVASTATIN 20 MG PO TABS: 20.0000 mg | ORAL_TABLET | Freq: Every day | ORAL | Status: AC

## 2013-11-06 NOTE — Progress Notes (Signed)
Inpatient Diabetes Program Recommendations  AACE/ADA: New Consensus Statement on Inpatient Glycemic Control (2013)  Target Ranges:  Prepandial:   less than 140 mg/dL      Peak postprandial:   less than 180 mg/dL (1-2 hours)      Critically ill patients:  140 - 180 mg/dL   Results for [name], Jeff T (MRN [id number]) as of 11/06/2013 09:01  Ref. Range 11/05/2013 07:21 11/05/2013 11:23 11/05/2013 16:56 11/05/2013 20:53 11/06/2013 07:29  Glucose-Capillary Latest Range: 70-99 mg/dL 409169 (H) 811221 (H) 914197 (H) 267 (H) 206 (H)   Diabetes history: No Outpatient Diabetes medications: NA Current orders for Inpatient glycemic control: Novolog 0-9 units AC  Inpatient Diabetes Program Recommendations Correction (SSI): Please increase Novolog correction to moderate scale and add Bedtime correction scale.  Oral Agents: Noted patient will likely be started on Metformin if A1C indicative of diabetes. HgbA1C: A1C is 8.6% on 11/05/13 which meets ADA criteria for diabetes diagnosis. Please inform patient of dx and let staff know so that patient can be educated prior to discharge. Diet: Please change diet to Heart Healthy Carb Modified diet.   Thanks, Jeff PennerMarie Hyacinth Marcelli, RN, MSN, CCRN Diabetes Coordinator Inpatient Diabetes Program [phone] (Team Pager) [phone] (AP office) [phone] [facility] office)

## 2013-11-06 NOTE — Care Management Note (Signed)
    Page 1 of 1   11/06/2013     3:08:22 PM CARE MANAGEMENT NOTE 11/06/2013  Patient:  Jeff Powers,Jeff Powers   Account Number:  [id number]  Date Initiated:  11/06/2013  Documentation initiated by:  Anibal HendersonBOLDEN,Clifton Safley  Subjective/Objective Assessment:   Admitted with numbness in leg and arm.  He is from home, alone, and is independent. He will return home at D/C.     Action/Plan:   [facility]   Anticipated DC Date:  11/06/2013   Anticipated DC Plan:  HOME/SELF CARE      DC Planning Services  CM consult      Choice offered to / List presented to:             Status of service:  Completed, signed off Medicare Important Message given?   (If response is "NO", the following Medicare IM given date fields will be blank) Date Medicare IM given:   Medicare IM given by:   Date Additional Medicare IM given:   Additional Medicare IM given by:    Discharge Disposition:  HOME/SELF CARE  Per UR Regulation:  Reviewed for med. necessity/level of care/duration of stay  If discussed at Long Length of Stay Meetings, dates discussed:    Comments:  11/06/13 1500 Anibal HendersonGeneva Anaisabel Pederson RN/CM

## 2013-11-06 NOTE — Progress Notes (Signed)
Patient with orders to be discharge home. Discharge instructions given, patient verbalized understanding. Diabetes education given. Prescriptions given. Patient stable. Patient left in private vehicle with family.

## 2013-11-06 NOTE — Procedures (Signed)
  [facility] Wanza Szumski A. Gerilyn Pilgrimoonquah, MD     [email or web address]           HISTORY: This is a 53 year old man who Presents wit an episode of confusion associated with tingling and numbness of the left side. Spell is suspicious for seizures.  MEDICATIONS: Scheduled Meds: Continuous Infusions: PRN Meds:.    Prior to Admission medications   Medication Sig Start Date End Date Taking? Authorizing Provider  aspirin EC 81 MG tablet Take 1 tablet (81 mg total) by mouth daily. 11/06/13   Maryann Mikhail, DO  metFORMIN (GLUCOPHAGE) 500 MG tablet Take 1 tablet (500 mg total) by mouth 2 (two) times daily with a meal. 11/06/13   Maryann Mikhail, DO  simvastatin (ZOCOR) 20 MG tablet Take 1 tablet (20 mg total) by mouth daily at 6 PM. 11/06/13   Maryann Mikhail, DO      ANALYSIS: A 16 channel recording using standard 10 20 measurements is conducted for 20 minutes. There is a low voltage electrocortical activity of 18 Hz which attenuates with eye opening. Awake and drowsy activities are observed. Photic simulation and hyperventilation were not carried out. There are no focal slowing or lateralized slowing observed. There are no Epileptiform activities observed.   IMPRESSION: 1. This is a normal recording of the awake and drowsy states.      Laryn Venning A. Gerilyn Pilgrimoonquah, M.D.  Diplomate, Biomedical engineerAmerican Board of Psychiatry and Neurology ( Neurology).

## 2013-11-06 NOTE — Discharge Instructions (Signed)
Diabetes mellitus tipo2 (Type 2 Diabetes Mellitus) La diabetes mellitus tipo2, generalmente denominada diabetes tipo2, es una enfermedad prolongada (crnica). En la diabetes tipo2, el pncreas no produce suficiente insulina (una hormona), las clulas son menos sensibles a la insulina que se produce (resistencia a la insulina), o ambos. Normalmente, la Johnson Controlsinsulina mueve los azcares de los alimentos a las clulas de los tejidos. Las clulas de los tejidos Cendant Corporationutilizan los azcares para Psychiatristobtener energa. La falta de insulina o la falta de una respuesta normal a la insulina hace que el exceso de azcar se acumule en la sangre en lugar de Customer service managerpenetrar en las clulas de los tejidos. Como resultado, se producen niveles altos de Bankerazcar en la sangre (hiperglucemia). El [address]fecto de los niveles altos de azcar (glucosa) puede causar muchas complicaciones.  La diabetes tipo2 antes tambin se denominaba diabetes del Buckhead Ridgeadulto, pero puede ocurrir a Actuarycualquier edad.  FACTORES DE RIESGO  Una persona tiene mayor predisposicin a desarrollar diabetes tipo 2 si alguien en su familia tiene la enfermedad y tambin tiene uno o ms de los siguientes factores de riesgo principales:  Sobrepeso.  Estilo de vida sedentario.  Una historia de consumo constante de alimentos de altas caloras. Mantener un peso saludable y realizar actividad fsica regular puede reducir la probabilidad de desarrollar diabetes tipo 2. SNTOMAS  Una persona con diabetes tipo 2 no presenta sntomas en un principio. Los sntomas de la diabetes tipo 2 aparecen lentamente. Los sntomas son:  Aumento de la sed (polidipsia).  Aumento de la miccin (poliuria).  Orina con ms frecuencia durante la noche (nocturia).  Prdida de peso. La prdida de peso puede ser muy rpida.  Infecciones frecuentes y recurrentes.  Cansancio (fatiga).  Debilidad.  Cambios en la visin, como visin borrosa.  Olor a Water quality scientistfruta en el aliento.  Dolor abdominal.  Nuseas o  vmitos.  Cortes o moretones que tardan en sanarse.  Hormigueo o adormecimiento de las manos y los pies. DIAGNSTICO Con frecuencia la diabetes tipo 2 no se diagnostica hasta que se presentan las complicaciones de la diabetes. La diabetes tipo 2 se diagnostica cuando los sntomas o las complicaciones se presentan y cuando aumentan los niveles de glucosa en la [place]angre. El nivel de glucosa en la sangre puede controlarse en uno o ms de los siguientes anlisis de sangre:  Medicin de glucosa en la sangre en [place] Parkayunas. No se le permitir comer durante al menos 8 horas antes de que se tome Colombiauna muestra de [place] Harborsangre.  Pruebas al azar de glucosa en la sangre. El nivel de glucosa en la sangre se controla en cualquier momento del da sin importar el momento en que haya comido.  Prueba de A1c (hemoglobina glucosilada) Una prueba de A1c proporciona informacin sobre el control de la glucosa en la sangre durante los ltimos 3 meses.  Prueba de tolerancia a la glucosa oral (PTGO). La glucosa en la sangre se mide despus de no haber comido (ayunas) durante dos horas y despus de beber una bebida que contenga glucosa. TRATAMIENTO   Usted puede necesitar administrarse insulina o medicamentos para la diabetes todos los das para State Street Corporationmantener los niveles de glucosa en la sangre en el rango deseado.  Si [place]a insulina, tal vez necesite ajustar la dosis segn los carbohidratos que haya consumido en cada comida o colacin. El objetivo del tratamiento es mantener el nivel de azcar en la sangre previo a comer (glucosa preprandial) entre 4270 y 130mg /dl. INSTRUCCIONES PARA EL CUIDADO EN EL HOGAR   Controle su nivel de  hemoglobina A1c dos veces al ao.  Contrlese a diario Air cabin crew de glucosa en la sangre segn las indicaciones de su mdico.  Supervise las cetonas en la orina cuando est enferma y segn las indicaciones de su mdico.  Tome el medicamento para la diabetes o adminstrese insulina segn las indicaciones de su  mdico para Radio producer nivel de glucosa en la sangre en el rango deseado.  Nunca se quede sin medicamento para la diabetes o sin insulina. Es necesario que la reciba [employer].  Si [place] insulina, tal vez deba ajustar la cantidad de insulina administrada segn los carbohidratos consumidos. Los hidratos de carbono pueden aumentar los niveles de glucosa en la sangre, pero deben incluirse en su dieta. Los hidratos de carbono aportan vitaminas, minerales y [place] que son [place] parte esencial de una dieta saludable. Los hidratos de carbono se encuentran en frutas, verduras, cereales integrales, productos lcteos, legumbres y alimentos que contienen azcares aadidos.  Consuma alimentos saludables. Programe una cita con un nutricionista certificado para que lo ayude a Chief Executive Officer de alimentacin adecuado para usted.  Baje de peso si es necesario.  Lleve una tarjeta de alerta mdica o use una pulsera o medalla de alerta mdica.  Lleve con usted una colacin de 15gramos de hidratos de carbono en todo momento para controlar los niveles bajos de glucosa en la sangre (hipoglucemia). Algunos ejemplos de colaciones de 15gramos de hidratos de carbono son los siguientes:  Tabletas de glucosa, 3 o 4.  Gel de glucosa, tubo de 15 gramos.  Pasas de uva, 2 cucharadas (24 gramos).  Caramelos de goma, 6.  Galletas de [place], 8.  Gaseosa comn, 4onzas ( ).  Pastillas de goma, 9.  Reconocer la hipoglucemia. La hipoglucemia se produce cuando los niveles de glucosa en la sangre son de 70 mg/dl o menos. El riesgo de hipoglucemia aumenta durante el ayuno o cuando se saltea las comidas, durante o despus de Education officer, environmental ejercicio intenso y [place] duerme. Los sntomas de hipoglucemia son:  Temblores o sacudidas.  Disminucin de la capacidad de concentracin.  Sudoracin.  Aumento de la frecuencia cardaca.  Dolor de [place].  Sequedad en la  boca.  Hambre.  Irritabilidad.  Ansiedad.  Sueo agitado.  Alteracin del habla o de la coordinacin.  Confusin.  Tratar la hipoglucemia rpidamente. Si usted est alerta y puede tragar con seguridad, siga la regla de 15/15 que consiste en:  [employer] 15 y 20gramos de glucosa de accin rpida o carbohidratos. Las opciones de accin rpida son un gel de glucosa, tabletas de glucosa, o 4 onzas (120 ml) de jugo de frutas, gaseosa comn, o leche baja en grasa.  Compruebe su nivel de glucosa en la sangre 15 minutos despus de tomar la glucosa.  Tome entre 15 y 20 gramos ms de glucosa si el nivel de glucosa en la sangre todava es de /dl o inferior.  Ingiera una comida o una colacin en el lapso de 1 hora una vez que los niveles de glucosa en la sangre vuelven a la normalidad.  Est atento a si siente mucha sed u orina con mayor frecuencia, porque son signos tempranos de hiperglucemia. El reconocimiento temprano de la hiperglucemia permite un tratamiento oportuno. Trate la hiperglucemia segn le indic su mdico.  Haga, al menos, de actividad fsica moderada a la semana, distribuidos en, por lo menos, 3das a la semana o como lo indique su mdico. Adems, debe realizar ejercicios de resistencia por lo menos 2veces a la [employer]  o como lo indique su mdico. Trate de no permanecer inactivo durante ms de seguidos.  Ajuste su medicamento y la ingesta de alimentos, segn sea necesario, si inicia un nuevo ejercicio o deporte.  Siga su plan para los [address] de enfermedad cuando no puede comer o beber como de [place].  No consuma ningn producto que contenga tabaco, como cigarrillos, tabaco de Theatre manager o Administrator, Civil Service. Si necesita ayuda para dejar de fumar, hable con el mdico.  Limite el consumo de alcohol a no ms de 1 medida por da en las mujeres no embarazadas y 2 medidas en los hombres. Debe beber alcohol solo mientras come. Hable con su mdico  acerca de si el alcohol es seguro para usted. Informe a su mdico si bebe alcohol varias veces a la semana.  Concurra a todas las visitas de control como se lo haya indicado el mdico. Esto es importante.  Programe un examen de la vista poco despus del diagnstico de diabetes tipo 2 y luego anualmente.  Realice diariamente el cuidado de la piel y de los pies. Examine su piel y los pies diariamente para ver si tiene cortes, moretones, enrojecimiento, problemas en las uas, sangrado, ampollas o Advertising account planner. Su mdico debe hacerle un examen de los pies una vez por ao.  Cepllese los dientes y encas por lo menos dos veces al da y use hilo dental al menos una vez por da. Concurra regularmente a las visitas de control con el dentista.  Comparta su plan de control de diabetes en el trabajo o en la escuela.  Mantngase al da con las vacunas. Se recomienda que las [employer] de [id number] con diabetes se apliquen la vacuna contra la neumona. En algunos casos, pueden administrarse dos inyecciones separadas. Pregntele al mdico si tiene la vacuna contra la neumona al da.  Aprenda a Dealer.  Obtenga la mayor cantidad posible de informacin sobre la diabetes y solicite ayuda siempre que sea necesario.  Busque programas de rehabilitacin y participe en ellos para mantener o mejorar su independencia y su calidad de vida. Solicite la derivacin a fisioterapia o terapia ocupacional si se le [employer] o la mano, o tiene problemas para asearse, vestirse, comer, o durante la [place] fsica. SOLICITE ATENCIN MDICA SI:   No puede comer alimentos o beber por ms de 6 horas.  Tuvo nuseas o ha vomitado durante ms de 6 horas.  Su nivel de glucosa en la sangre es mayor de 240 mg/dl.  Presenta algn cambio en el estado mental.  Desarrolla una enfermedad grave adicional.  Tuvo diarrea durante ms de 6 horas.  Ha estado enfermo o ha tenido fiebre durante un par [address] y no  mejora.  Siente dolor al practicar cualquier actividad fsica. SOLICITE ATENCIN MDICA DE INMEDIATO SI:  Tiene dificultad para respirar.  Tiene niveles de cetonas moderados a altos. ASEGRESE DE QUE:  Comprende estas instrucciones.  Controlar su afeccin.  Recibir ayuda de inmediato si no mejora o si empeora. Document Released: 01/16/2005 Document Revised: 06/02/2013 [facility] Patient Information 2015 [place], [place]. This information is not intended to replace advice given to you by your health care provider. Make sure you discuss any questions you have with your health care provider. Ataque isqumico transitorio (Transient Ischemic Attack) Un ataque isqumico transitorio (AIT) es un "ictus de advertencia" que causa sntomas similares al ictus. A diferencia de un ictus, el AIT no causa dao permanente en el cerebro. Los sntomas pueden ocurrir muy rpido y no duran  mucho tiempo. Es Secretary/administrator los sntomas de un AIT y Wellsite geologist. De esta [place], se puede prevenir un ictus grave o la muerte. CAUSAS   La causa es una obstruccin temporaria de una arteria en el cerebro o en el cuello (arteria cartida). La obstruccin no permite que llegue el flujo de sangre que el cerebro necesita, y puede causar diferentes sntomas. La causa de la obstruccin puede ser:  Un cogulo sanguneo.  Acumulacin de grasa (placa) en una arteria del cuello o el cerebro. FACTORES DE RIESGO  Presin arterial elevada (hipertensin).  Tener colesterol elevado.  Diabetes mellitus.  Padecer enfermedad cardacas.  La acumulacin de depsitos de grasa en los vasos sanguneos (enfermedad arterial perifrica o aterosclerosis).  La acumulacin de placa en los vasos sanguneos que proveen sangre y oxgeno al cerebro (estenosis de la arteria cartida).  Ritmo cardaco anormal (fibrilacin auricular).  Obesidad.  Fumar.  Consumir anticonceptivos orales, especialmente en combinacin con el hbito de  fumar.  Falta de actividad fsica.  Dieta rica en grasas, sal (sodio) y muchas caloras.  Consumo de alcohol.  Consumo de drogas ilegales (especialmente cocana y metanfetamina).  Ser varn.  Pertenecer a Engineer, production.  Tener ms de 55aos.  Antecedentes familiares de ictus.  Historia previa de cogulos sanguneos, ictus, ataque isqumico transitorio o infarto.  Anemia drepanoctica. SNTOMAS  Los sntomas son los [employer] del ictus, pero son transitorios. Estos sntomas por lo general aparecen en forma repentina, o podran aparecer al despertar:  Debilidad o adormecimiento sbito de la cara, el brazo o la pierna, especialmente en un lado del cuerpo.  Dificultad repentina para caminar o para mover los brazos o las piernas.  Confusin sbita.  Cambios de personalidad sbitos.  Dificultad para hablar (afasia) o comprender.  Dificultad para tragar.  Dificultad sbita en la visin de uno o ambos ojos.  Visin doble.  Mareos.  Prdida del equilibrio o de la coordinacin.  Dolor de cabeza sbito sin causa aparente.  Dificultad para leer o escribir.  Prdida del control del intestino o de la vejiga.  Prdida de la conciencia. DIAGNSTICO  El mdico podr determinar la presencia de un AIT segn sus sntomas, la historia clnica y el examen fsico. Para identificar un ataque isqumico transitorio se realiza una tomografa computada del cerebro. Pueden realizarse otros estudios para confirmar el diagnstico. Estos estudios pueden incluir:  Materials engineer.  Monitorizacin electrocardiogrfica continua:  Ecocardiograma.  Ecografa de la cartida.  Imgenes por Health visitor (IRM).  Estudio de la circulacin cerebral.  Anlisis de [place]. PREVENCIN  El riesgo de sufrir un AIT puede disminuir si se trata adecuadamente la hipertensin arterial, el nivel elevado de colesterol, la diabetes, la enfermedad cardaca y la obesidad, si se deja  de fumar, se limita la ingesta de alcohol y se Audiological scientist. TRATAMIENTO  El tiempo es fundamental. Dado que los sntomas del AIT son los mismos que los del ictus, es importante recibir tratamiento tan pronto como sea posible porque puede necesitar un medicamento para Optometrist cogulo (tromboltico) que no puede administrarse si ha transcurrido [employer]. Las opciones de tratamiento varan. Las opciones de tratamiento incluye reposo, oxgeno, lquidos por va intravenosa y medicamentos que lican la sangre (anticoagulantes). Los medicamentos y la dieta se utilizan para tratar la diabetes, la hipertensin arterial y otros factores de Chief of Staff. Se tomarn medidas para evitar complicaciones a corto y a Air cabin crew, incluyendo las infecciones por materiales extraos en los pulmones (neumona por aspiracin), cogulos sanguneos en  las piernas y cadas. Las opciones de tratamiento incluyen procedimientos para eliminar la placa en la cartida o para dilatar la cartida que se ha estrechado debido a Electrical engineerla placa. Estos procedimientos son:  Endarterectoma carotdea.  Angioplastia carotdea y colocacin de stent. INSTRUCCIONES PARA EL CUIDADO EN EL HOGAR   Tome los medicamentos como le indic el mdico. Siga cuidadosamente las indicaciones. Podrn administrarle medicamentos para controlar los factores de riesgo del ictus. Asegrese de comprender todas las indicaciones para la utilizacin de los medicamentos.  Es posible que le indiquen que tome aspirina o el anticoagulante warfarina. Es necesario que reciba la warfarina exactamente segn las indicaciones.  Tomar demasiada o muy poca warfarina es peligroso. El exceso de warfarina aumenta el riesgo de Midwaysangrado. Dosis demasiado bajas de warfarina aumentan el riesgo de formacin de cogulos. Durante el tratamiento con warfarina, es necesario que se realicen peridicamente anlisis de sangre que miden el tiempo de Biomedical scientistcoagulacin. Un anlisis de sangre TP  mide el tiempo que tarda la sangre en coagularse. El TP se [place] Islandsutiliza para calcular otro valor llamado INR. Los resultados de esos anlisis ayudan a que el mdico ajuste la dosis de Arts administratorwarfarina. La dosis puede cambiarse por varias razones. Es de suma importancia tomar estos medicamentos tal como se indican.  Muchos de los alimentos, New York Life Insuranceespecialmente los que contienen vitamina K pueden interferir con la warfarina y Audiological scientistafectar el TP y el INR. Los alimentos con alto contenido de vitamina K incluyen espinaca, col rizada, brcoli, repollo, acelga y nabos verdes, repollitos de bruselas, garbanzos, coliflor, algas, perejil, y tambin bife e hgado de cerdo, t verde y aceite de soja. Debe consumir siempre la misma cantidad de alimentos ricos en vitamina K. Evite los Sonic Automotivecambios importantes en su dieta, o informe a su mdico antes de cambiar su dieta. Solicite una cita con un nutricionista para hacerle las preguntas que le surjan.  Muchos medicamentos interfieren con la warfarina y afectan el TP y el INR. Debe decirle a su mdico todas los medicamentos que toma, incluyendo las vitaminas y los suplementos nutricionales. Tenga especial cuidado con las aspirinas y los medicamentos antiinflamatorios. No tome ni suspenda ningn medicamento tanto recetado como de venta libre a menos que se lo haya indicado su mdico o Social research officer, governmentfarmacutico.  La warfarina puede tener efectos adversos, tales como hematomas o sangrado en exceso. Si se lastima, deber ejercer presin sobre las heridas por ms tiempo que lo habitual. Su mdico o su Administrator, sportsfarmacutico le comentarn otros posibles efectos secundarios.  Evite realizar actividades deportivas que puedan causar lesiones o sangrado.  Sea cuidadoso al Pitney Bowesafeitarse, Chemical engineerutilizar hilo dental, o manipular objetos cortantes.  El alcohol puede modificar la capacidad del organismo de metabolizar la warfarina. Es preferible evitar tomar bebidas alcohlicas o consumir solo muy pequeas cantidades cuando se est en  tratamiento con warfarina. Hgale saber a su mdico si modifica su consumo de alcohol.  Informe a su dentista o a otros profesionales antes de que le efecten cualquier procedimiento.  Consuma una dieta que incluya cinco porciones de frutas y vegetales o ms por Futures traderda. Esto puede reducir Lexmark Internationalel riesgo de sufrir un ictus. Se pueden indicar ciertas dietas para controlar la hipertensin arterial, los niveles elevados de colesterol, la diabetes o la obesidad.  Se recomienda una dieta baja en sodio, en grasas saturadas, en grasas trans y en colesterol para controlar la hipertensin.  Una dieta baja en grasas saturadas, grasas trans, en colesterol y alta en fibra puede controlar los niveles de Rosetocolesterol.  Se recomienda una dieta  con control de los carbohidratos y azcar en los casos de diabetes.  Se recomienda una dieta baja en caloras, en sodio, en grasas saturadas, en grasas trans y baja en colesterol para controlar la obesidad.  Mantenga un peso saludable.  Mantngase fsicamente activo. Se recomienda que realice al menos de [place] todos o casi [employer].  No fumar.  Limite el consumo de alcohol aunque no tome warfarina. Se considera consumo moderado:  No ms de 2 medidas por da para los hombres.  No ms de 1 medida por da para las mujeres que no estn [place].  Evite el consumo de drogas.  La seguridad en el hogar. Un ambiente seguro en el hogar es importante para reducir el riesgo de cadas. El mdico podr disponer que los especialistas lo evalen en su domicilio. Tambin es Programme researcher, broadcasting/film/video barras para sostn en la habitacin y el bao. El Economist un equipo para Musician, como un inodoro elevado o un asiento para la ducha.  Siga todas las instrucciones para Education officer, environmental un control con su mdico. Esto es muy importante. Aqu se incluyen derivaciones y anlisis de laboratorio. Un tratamiento adecuado puede prevenir que ocurra un ictus u otro  AIT. SOLICITE ATENCIN MDICA SI:  Hay cambios en su personalidad.  Tiene dificultad para tragar.  Tiene visin doble  Tiene mareos.  Tiene fiebre.  Tiene lceras en la piel. SOLICITE ATENCIN MDICA DE INMEDIATO SI:  Cualquiera de estos sntomas puede representar un problema grave y es Radio broadcast assistant. No espere para ver si los sntomas desaparecen. Solicite atencin mdica de inmediato. Comunquese con el servicio de emergencias de su localidad (911 en los [place]). No conduzca por sus propios medios [employer].  Siente debilidad o adormecimiento sbito de la cara, el brazo o la pierna, especialmente en un lado del cuerpo.  Tiene una dificultad repentina para caminar o para mover los brazos o las piernas.  Se siente repentinamente confundido.  Tiene dificultad para hablar (afasia) o comprender.  Presenta, sbitamente, dificultad para ver de uno o ambos ojos.  Pierde el equilibrio o la coordinacin.  Siente sbitamente un dolor de cabeza intenso que no tiene causa aparente.  Siente un nuevo dolor en el pecho o latidos cardacos irregulares.  Sufre la prdida parcial o total de la conciencia. ASEGRESE DE QUE:   Comprende estas instrucciones.  Controlar su afeccin.  Recibir ayuda de inmediato si no mejora o si empeora. Document Released: 10/26/2004 Document Revised: 01/21/2013 [facility] Patient Information 2015 [place], [place]. This information is not intended to replace advice given to you by your health care provider. Make sure you discuss any questions you have with your health care provider.

## 2013-11-06 NOTE — Discharge Summary (Addendum)
Physician Discharge Summary  Jeff Powers:[id number] DOB: 08-14-1960 DOA: 11/04/2013  PCP: No PCP Per Patient  Admit date: 11/04/2013 Discharge date: 11/06/2013  Time spent: 45 minutes  Recommendations for Outpatient Follow-up:  Patient will be discharged to home. He is to follow up with his primary care physician within one week of discharge. Patient should also have a BMP within one to 2 weeks of discharge due to new medications.  Will also need to follow up with Dr. Jeral Powers, neurosurgeon, on 11/24/2013 at 1:15 PM. Patient to continue taking his medications as prescribed. He will need to follow a heart healthy/carb modified diet.  Patient may resume activity as tolerated.  Discharge Diagnoses:  Left-sided numbness and tingling, suspect TIA Diabetes mellitus, type II Essential hypertension Hyperlipidemia  Discharge Condition: Stable  Diet recommendation: Heart healthy/carb modified  Filed Weights   11/04/13 2106 11/06/13 0210  Weight: 67.132 kg (148 lb) 68.8 kg (151 lb 10.8 oz)    History of present illness:  on 11/05/2013  Jeff Powers is a 53 y.o. 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 At 9:00 am 11/04/2013 he experienced some tingling in the left arm starting in the fingers extending up to the shoulder area. It lasted a few minutes and then resolved. And, then this recurred again, at 5 PM and this time he also had numbness in his left leg. Unclear if he was weak on that side. Denied any speech impairment. No difficulty swallowing. No seizure activity. No visual impairment. No headaches. He did have some neck pain, earlier yesterday. He had Bell's palsy last year. He stated that he has never had a stroke. He has not taken any blood pressure medicine in many years despite being diagnosed with Hypertension many years ago in [place]. Since the symptoms  recurred he decided to seek attention.  Hospital Course:  Left-sided numbness and tingling, suspect TIA  -Symptoms have resolved -CT of the head: Normal and stable examination  -MRI head: Normal MRI of the brain -MRA head: Normal -LDL is 106, hemoglobin A1c is 8.6  -PT and OT have also been consulted and did not recommend further therapy -Continue aspirin statin  New onset- Diabetes Mellitus, Type 2 -Hemoglobin A1c 8.6 -Patient will be started on metformin -Patient was counseled on diet modifications as well as the need to have annual eye exams, frequent foot exams -Patient will need to follow up with his primary care physician regarding his blood sugar and management, as well as monitor his BMP.  Hypertension  -Patient states he does not take any medications, however did this several years ago  -Will of a permissive hypertension however patient will likely need to be discharged with antihypertensive is  Hyperlipidemia -Lipid panel: Total cholesterol 195, triglycerides 147, HDL 50, LDL 106 -Patient started on low-dose simvastatin  Neck pain -MRI of the cervical spine: Mark C. level disc bulging, CSF surrounding the cord is effaced from C3-C4 through C5-C6. Disc bulging and uncinate spurring contribute to mild foraminal narrowing at C3-4 and C4-5.  -Patient is to followup with neurosurgery, Dr. Jeral Powers  Procedures: Carotid doppler Minimal plaque formation in the carotid systems. No evidence of hemodynamically significant stenosis.  Echocardiogram Study Conclusions - Left ventricle: The cavity size was normal. Wall thickness was normal. Systolic function was normal. The estimated ejection fraction was in the range of 60% to 65%. Wall motion was  normal; there were no regional wall motion abnormalities. Left ventricular diastolic function parameters were normal. - Mitral valve: There was trivial regurgitation. - Left atrium: The atrium was very mildly dilated.  EEG results  pending  Consultations: Neurosurgery via phone  Discharge Exam: Filed Vitals:   11/06/13 0935  BP: 143/73  Pulse: 65  Temp: 98.1 F (36.7 C)  Resp: 18     General: Well developed, well nourished, NAD, appears stated age  HEENT: NCAT, mucous membranes moist.  Cardiovascular: S1 S2 auscultated, no rubs, murmurs or gallops. Regular rate and rhythm.  Respiratory: Clear to auscultation bilaterally with equal chest rise  Abdomen: Soft, nontender, nondistended, + bowel sounds  Extremities: warm dry without cyanosis clubbing or edema  Neuro: AAOx3, no focal deficits  Psych: Normal affect and demeanor with intact judgement and insight  Discharge Instructions      Discharge Instructions   Discharge instructions    Complete by:  As directed   Patient will be discharged to home. He is to follow up with his primary care physician within one week of discharge. Patient should also have a BMP within one to 2 weeks of discharge due to new medications.  Will also need to follow up with Dr. Jeral Powers, neurosurgeon, on 11/24/2013 at 1:15 PM. Patient to continue taking his medications as prescribed. He will need to follow a heart healthy/carb modified diet.  Patient may resume activity as tolerated.            Medication List         aspirin EC 81 MG tablet  Take 1 tablet (81 mg total) by mouth daily.     metFORMIN 500 MG tablet  Commonly known as:  GLUCOPHAGE  Take 1 tablet (500 mg total) by mouth 2 (two) times daily with a meal.     simvastatin 20 MG tablet  Commonly known as:  ZOCOR  Take 1 tablet (20 mg total) by mouth daily at 6 PM.       No Known Allergies Follow-up Information   Follow up with Jeff Cassis, MD On 11/24/2013. (at 1:15pm, for neck pain)    Specialty:  Neurosurgery   Contact information:   [address] [place] [address] [phone]       Follow up with PCP. Schedule an appointment as soon as possible for a visit in 1 week.  [facility] followup, Diabetes and high blood pressure followup, repeat BMP)        The results of significant diagnostics from this hospitalization (including imaging, microbiology, ancillary and laboratory) are listed below for reference.    Significant Diagnostic Studies: Dg Chest 2 View  11/05/2013   CLINICAL DATA:  Acute onset left hand tingling and numbness ; hypertension  EXAM: CHEST  2 VIEW  COMPARISON:  None.  FINDINGS: Lungs are clear. Heart size and pulmonary vascularity are within normal limits. No adenopathy. No bone lesions.  IMPRESSION: No abnormality noted.   Electronically Signed   By: Bretta Bang M.D.   On: 11/05/2013 13:56   Ct Head Wo Contrast  11/04/2013   CLINICAL DATA:  Numbness which began earlier today in the fingers of the left hand, then advanced up the left arm and now involving the left leg also. Occipital headache.  EXAM: CT HEAD WITHOUT CONTRAST  TECHNIQUE: Contiguous axial images were obtained from the base of the skull through the vertex without intravenous contrast.  COMPARISON:  10/16/2012.  FINDINGS: Ventricular system normal in size and appearance  for age. No mass lesion. No midline shift. No acute hemorrhage or hematoma. No extra-axial fluid collections. No evidence of acute infarction. No focal brain parenchymal abnormalities. No significant interval change.  No focal osseous abnormalities involving the skull. Visualized paranasal sinuses, bilateral mastoid air cells, and bilateral middle ear cavities well-aerated.  IMPRESSION: Normal and stable examination.   Electronically Signed   By: Hulan Saas M.D.   On: 11/04/2013 22:04   Mr Maxine Glenn Head Wo Contrast  11/05/2013   CLINICAL DATA:  Headache. Left hand numbness. Dizziness. Symptoms of 1 day duration.  EXAM: MRI HEAD WITHOUT CONTRAST  MRA HEAD WITHOUT CONTRAST  TECHNIQUE: Multiplanar, multiecho pulse sequences of the brain and surrounding structures were obtained without intravenous contrast. Angiographic  images of the head were obtained using MRA technique without contrast.  COMPARISON:  Head CT 11/04/2013  FINDINGS: MRI HEAD FINDINGS  The brain has normal appearance on all pulse sequences without evidence of old or acute infarction, malformation, atrophy, mass lesion, hemorrhage, hydrocephalus or extra-axial collection. No pituitary mass. No inflammatory sinus disease. Insignificant retention cyst at the inferior maxillary sinus on the right. No skull or skullbase lesion.  MRA HEAD FINDINGS  Both internal carotid arteries are widely patent into the brain. The anterior and middle cerebral vessels are patent without proximal stenosis, aneurysm or vascular malformation. Both vertebral arteries are widely patent to the basilar. Incidental and insignificant proximal basilar fenestration. Posterior circulation branch vessels are patent.  IMPRESSION: Normal MRI of the brain.  Normal intracranial MR angiography of the large and medium size vessels.   Electronically Signed   By: Paulina Fusi M.D.   On: 11/05/2013 13:26   Mri Brain Without Contrast  11/05/2013   CLINICAL DATA:  Headache. Left hand numbness. Dizziness. Symptoms of 1 day duration.  EXAM: MRI HEAD WITHOUT CONTRAST  MRA HEAD WITHOUT CONTRAST  TECHNIQUE: Multiplanar, multiecho pulse sequences of the brain and surrounding structures were obtained without intravenous contrast. Angiographic images of the head were obtained using MRA technique without contrast.  COMPARISON:  Head CT 11/04/2013  FINDINGS: MRI HEAD FINDINGS  The brain has normal appearance on all pulse sequences without evidence of old or acute infarction, malformation, atrophy, mass lesion, hemorrhage, hydrocephalus or extra-axial collection. No pituitary mass. No inflammatory sinus disease. Insignificant retention cyst at the inferior maxillary sinus on the right. No skull or skullbase lesion.  MRA HEAD FINDINGS  Both internal carotid arteries are widely patent into the brain. The anterior and  middle cerebral vessels are patent without proximal stenosis, aneurysm or vascular malformation. Both vertebral arteries are widely patent to the basilar. Incidental and insignificant proximal basilar fenestration. Posterior circulation branch vessels are patent.  IMPRESSION: Normal MRI of the brain.  Normal intracranial MR angiography of the large and medium size vessels.   Electronically Signed   By: Paulina Fusi M.D.   On: 11/05/2013 13:26   Mr Cervical Spine Wo Contrast  11/05/2013   CLINICAL DATA:  Headache with left hand numbness and dizziness for 1 day. No known injury. Initial encounter.  EXAM: MRI CERVICAL SPINE WITHOUT CONTRAST  TECHNIQUE: Multiplanar, multisequence MR imaging of the cervical spine was performed. No intravenous contrast was administered.  COMPARISON:  Head CT 11/04/2013. Report only from cervical MRI 09/15/2002.  FINDINGS: The cervical alignment is normal. There is no evidence of acute cervical spine fracture or paraspinous ligamentous injury. 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  The craniocervical junction appears normal. The cervical cord is  normal in signal and caliber. There are bilateral vertebral artery flow voids. Prominent lymphoid tissue is noted with in Waldeyer's ring. No cervical adenopathy apparent. There are mucous retention cysts within the right maxillary sinus.  C2-3:  Normal interspace.  C3-4: Disc bulging and uncinate spurring efface the CSF surrounding the cord. There is no cord deformity. Mild foraminal narrowing is present bilaterally.  C4-5: There is disc bulging with a small central disc protrusion and asymmetric uncinate spurring on the left. The CSF surrounding the cord is effaced without cord deformity. There is mild left greater than right foraminal stenosis.  C5-6: There is mild disc bulging with effacement of the CSF surrounding the cord. There is no cord deformity or significant foraminal compromise.  C6-7: Mild disc bulging. No cord  deformity or foraminal compromise.  C7-T1: No significant findings.  IMPRESSION: 1. 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 The CSF surrounding the cord is effaced from C3-4 through C5-6. There is no cord deformity or abnormal cord signal. 2. Disc bulging and uncinate spurring contribute to mild foraminal narrowing at C3-4 and C4-5. There is no definite nerve root encroachment. 3. Nonspecific prominence of the lymphoid tissue in Waldeyer's ring. Correlate clinically.   Electronically Signed   By: Roxy Horseman M.D.   On: 11/05/2013 13:01   US Carotid Duplex Bilateral  11/05/2013   CLINICAL DATA:  TIA with LEFT-sided weakness and numbness for 1 day, personal history hypertension  EXAM: BILATERAL CAROTID DUPLEX ULTRASOUND  TECHNIQUE: Wallace Cullens scale imaging, color Doppler and duplex ultrasound were performed of bilateral carotid and vertebral arteries in the neck.  COMPARISON:  None  FINDINGS: Criteria: Quantification of carotid stenosis is based on velocity parameters that correlate the residual internal carotid diameter with NASCET-based stenosis levels, using the diameter of the distal internal carotid lumen as the denominator for stenosis measurement.  The following velocity measurements were obtained:  RIGHT  ICA:  67/26 cm/sec  CCA:  112/23 cm/sec  SYSTOLIC ICA/CCA RATIO:  0.60  DIASTOLIC ICA/CCA RATIO:  1.15  ECA:  128 cm/sec  LEFT  ICA:  90/21 cm/sec  CCA:  101/19 cm/sec  SYSTOLIC ICA/CCA RATIO:  0.89  DIASTOLIC ICA/CCA RATIO:  1.09  ECA:  100 cm/sec  RIGHT CAROTID ARTERY: Intimal thickening RIGHT CCA. Minimal plaque LEFT carotid bulb and proximal RIGHT ICA. Calcified shadowing plaque proximal RIGHT ECA. Laminar flow by color Doppler imaging no spectral broadening is seen on waveform analysis in and mid to distal RIGHT ICA. No high velocity jets.  RIGHT VERTEBRAL ARTERY:  Patent, antegrade  LEFT CAROTID ARTERY: Intimal thickening LEFT CCA. Minimal amount of  noncalcified plaque at LEFT carotid bulb. Mildly tortuous LEFT carotid system. Minimal spectral broadening LEFT ICA on waveform analysis. No high velocity jets.  LEFT VERTEBRAL ARTERY:  Patent, antegrade  IMPRESSION: Minimal plaque formation in the carotid systems as above.  No evidence of hemodynamically significant stenosis.   Electronically Signed   By: Ulyses Southward M.D.   On: 11/05/2013 14:52    Microbiology: Recent Results (from the past 240 hour(s))  MRSA PCR SCREENING     Status: Abnormal   Collection Time    11/05/13  5:02 AM      Result Value Ref Range Status   MRSA by PCR INVALID RESULTS, SPECIMEN SENT FOR CULTURE (*) NEGATIVE Final   Comment:            The GeneXpert MRSA Assay (FDA     approved for  NASAL specimens     only), is one component of a     comprehensive MRSA colonization     surveillance program. It is not     intended to diagnose MRSA     infection nor to guide or     monitor treatment for     MRSA infections.     RESULT CALLED TO, READ BACK BY AND VERIFIED WITH:     SCHONEWITZ,L. AT 1100 ON 11/05/2013 BY BAUGHAM,M.     Labs: Basic Metabolic Panel:  Recent Labs Lab 11/04/13 2120  NA 138  K 4.2  CL 98  CO2 27  GLUCOSE 288*  BUN 18  CREATININE 0.77  CALCIUM 9.7   Liver Function Tests:  Recent Labs Lab 11/04/13 2120  AST 25  ALT 35  ALKPHOS 106  BILITOT 0.8  PROT 8.2  ALBUMIN 4.2   No results found for this basename: LIPASE, AMYLASE,  in the last 168 hours No results found for this basename: AMMONIA,  in the last 168 hours CBC:  Recent Labs Lab 11/04/13 2120  WBC 8.5  NEUTROABS 5.6  HGB 16.4  HCT 46.1  MCV 88.5  PLT 290   Cardiac Enzymes: No results found for this basename: CKTOTAL, CKMB, CKMBINDEX, TROPONINI,  in the last 168 hours BNP: BNP (last 3 results) No results found for this basename: PROBNP,  in the last 8760 hours CBG:  Recent Labs Lab 11/05/13 0721 11/05/13 1123 11/05/13 1656 11/05/13 2053 11/06/13 0729    GLUCAP 169* 221* 197* 267* 206*       Signed:  Edsel PetrinMIKHAIL, Lynley Killilea  [facility] 11/06/2013, 10:44 AM

## 2013-11-07 LAB — MRSA CULTURE

## 2015-05-16 IMAGING — US US CAROTID DUPLEX BILAT
1 series · 13 of 24 positions shown · non-contrast
Comparison: None

CLINICAL DATA: TIA with LEFT-sided weakness and numbness for 1 day,
personal history hypertension

EXAM:
BILATERAL CAROTID DUPLEX ULTRASOUND
TECHNIQUE: Gray scale imaging, color Doppler and duplex ultrasound were
performed of bilateral carotid and vertebral arteries in the neck.

[Series 1: us carotid duplex bilat · 0.06mm/px · 13 of 94 slices shown]
[im 1/94]
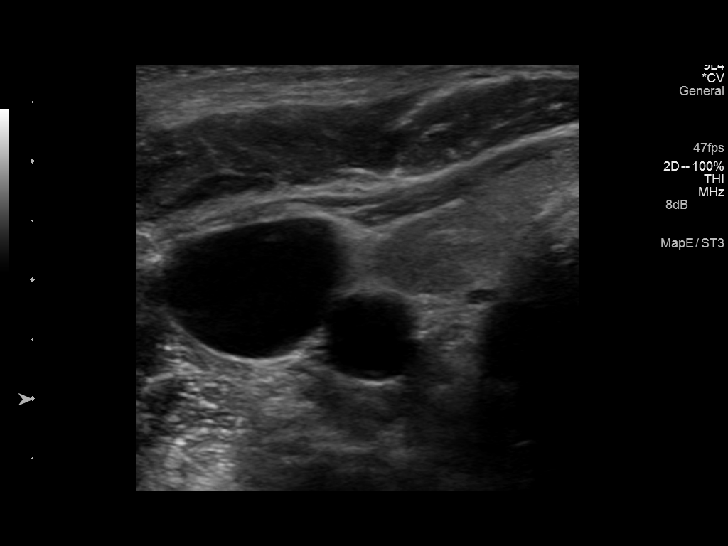
[im 9/94]
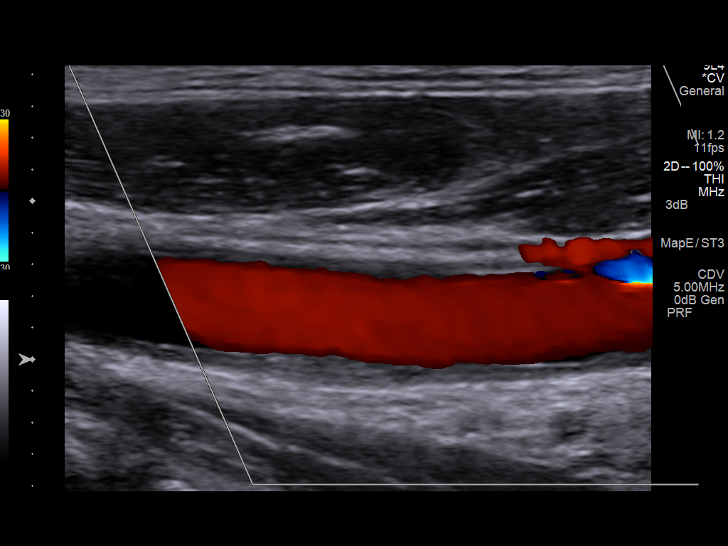
[im 17/94]
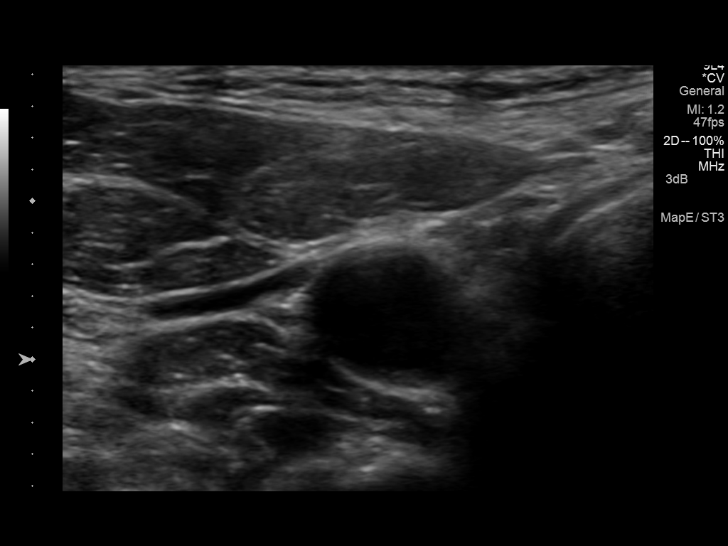
[im 25/94]
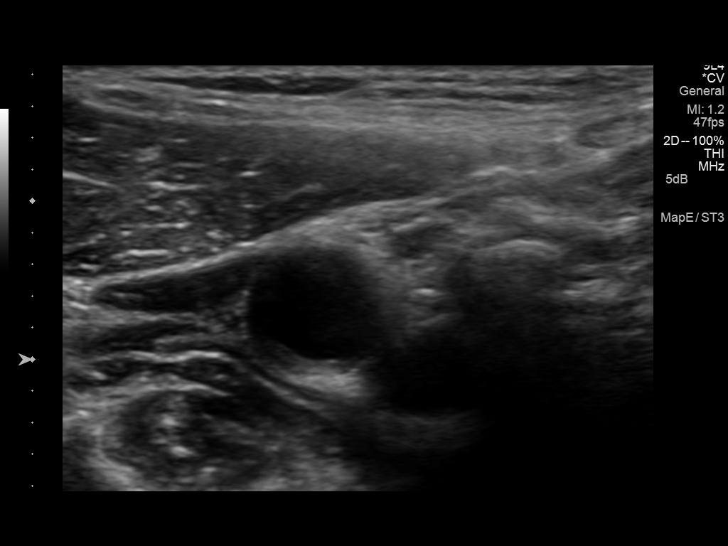
[im 33/94]
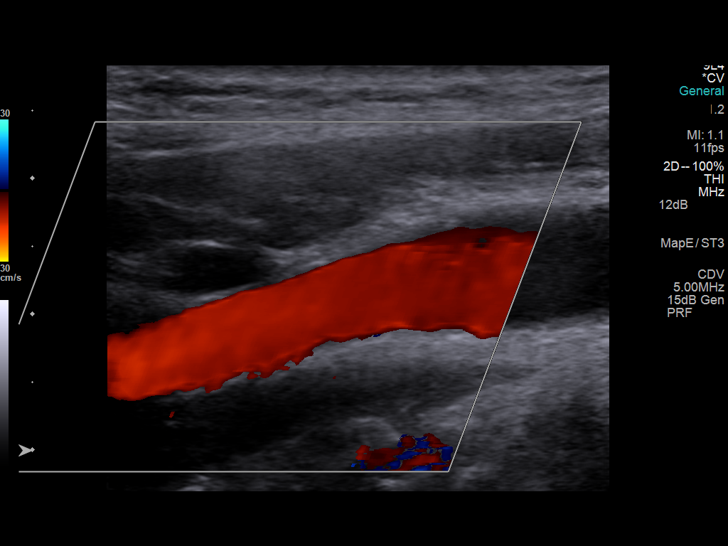
[im 41/94]
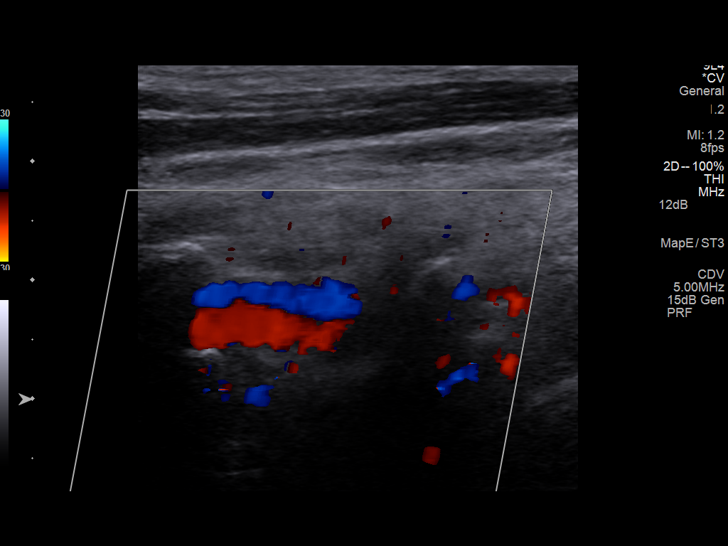
[im 49/94]
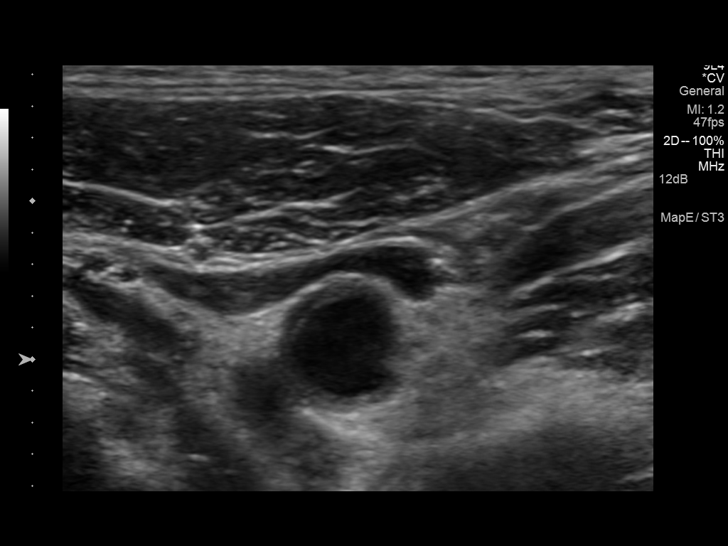
[im 53/94]
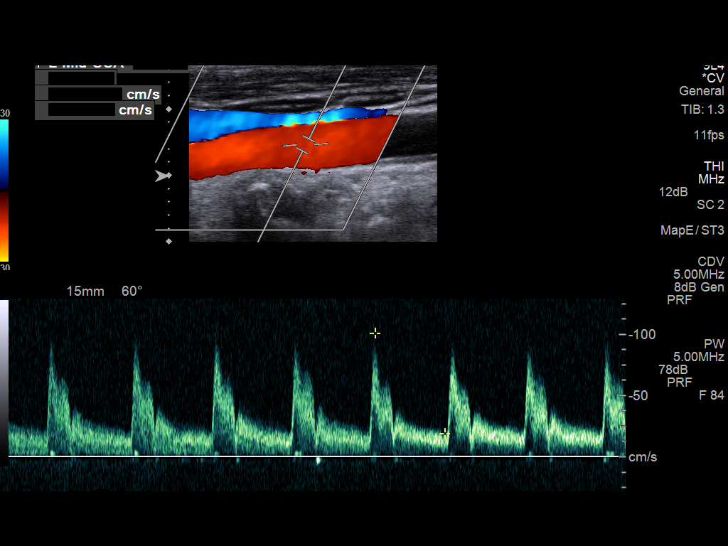
[im 61/94]
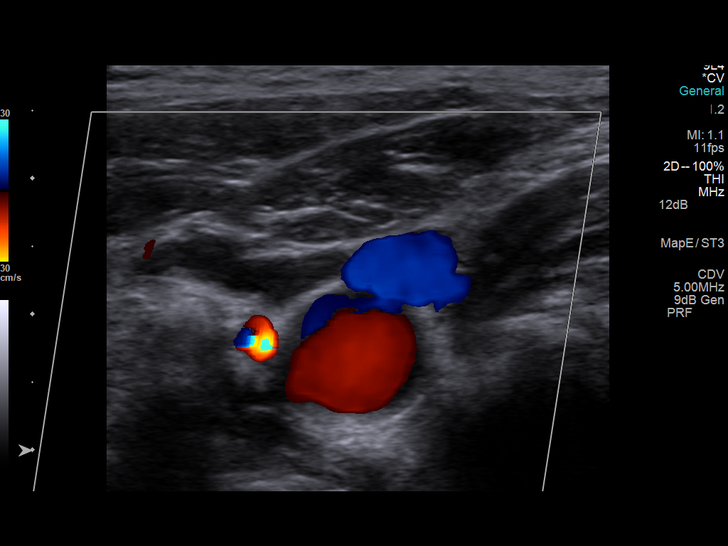
[im 69/94]
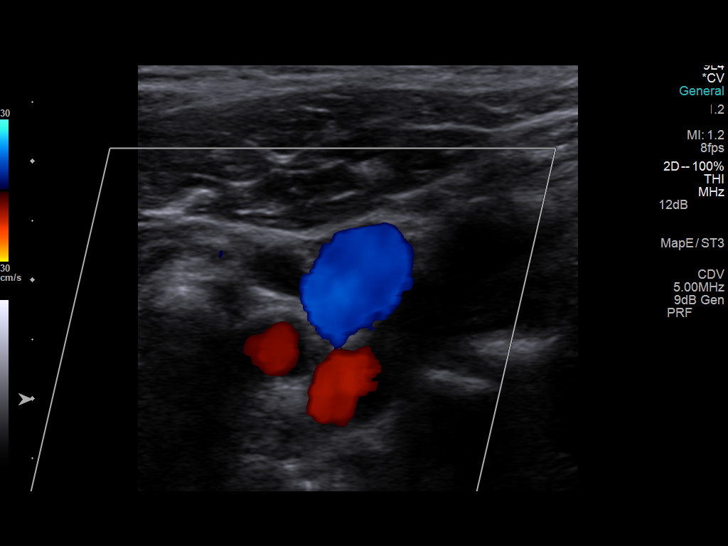
[im 77/94]
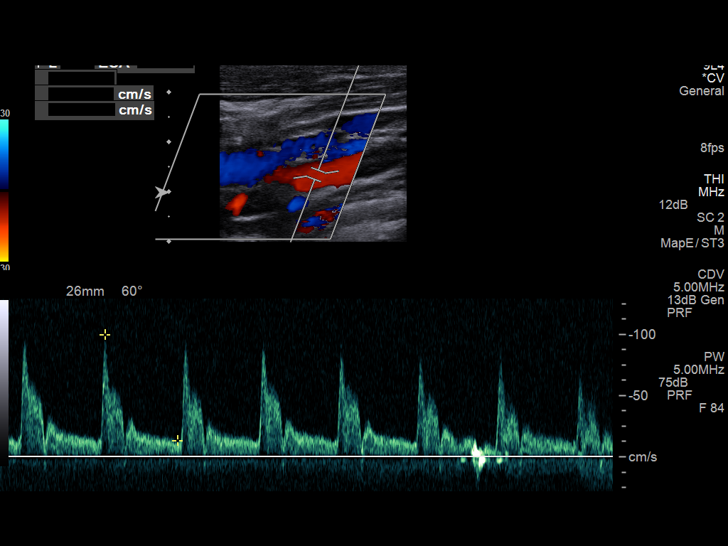
[im 85/94]
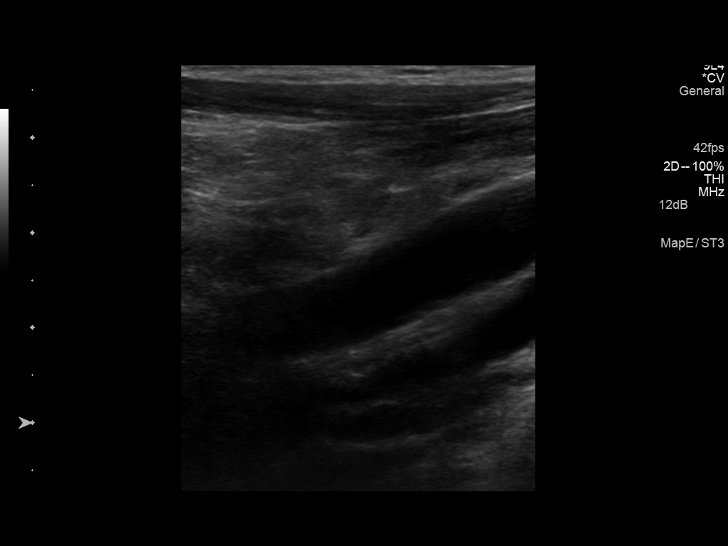
[im 94/94]
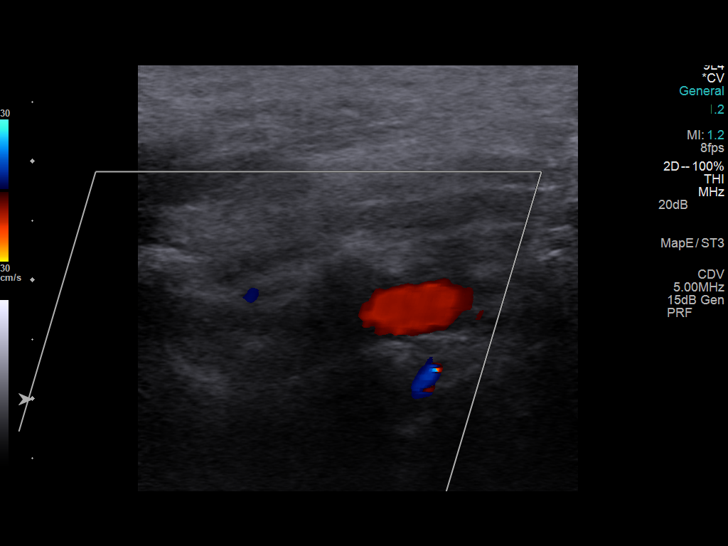

[13 of 24 positions shown; findings below may reference images not displayed]

FINDINGS: Criteria: Quantification of carotid stenosis is based on velocity
parameters that correlate the residual internal carotid diameter
with NASCET-based stenosis levels, using the diameter of the distal
internal carotid lumen as the denominator for stenosis measurement.

The following velocity measurements were obtained:

RIGHT

ICA:  67/26 cm/sec

CCA:  112/23 cm/sec

SYSTOLIC ICA/CCA RATIO:

DIASTOLIC ICA/CCA RATIO:

ECA:  128 cm/sec

LEFT

ICA:  90/21 cm/sec

CCA:  101/19 cm/sec

SYSTOLIC ICA/CCA RATIO:

DIASTOLIC ICA/CCA RATIO:

ECA:  100 cm/sec

RIGHT CAROTID ARTERY: Intimal thickening RIGHT CCA. Minimal plaque
LEFT carotid bulb and proximal RIGHT ICA. Calcified shadowing plaque
proximal RIGHT ECA. Laminar flow by color Doppler imaging no
spectral broadening is seen on waveform analysis in and mid to
distal RIGHT ICA. No high velocity jets.

RIGHT VERTEBRAL ARTERY:  Patent, antegrade

LEFT CAROTID ARTERY: Intimal thickening LEFT CCA. Minimal amount of
noncalcified plaque at LEFT carotid bulb. Mildly tortuous LEFT
carotid system. Minimal spectral broadening LEFT ICA on waveform
analysis. No high velocity jets.

LEFT VERTEBRAL ARTERY:  Patent, antegrade
IMPRESSION: Minimal plaque formation in the carotid systems as above.

No evidence of hemodynamically significant stenosis.

## 2015-05-16 IMAGING — CR DG CHEST 2V
2 series · 2 of 2 positions shown · non-contrast
Comparison: None.

CLINICAL DATA: Acute onset left hand tingling and numbness ;
hypertension

EXAM:
CHEST  2 VIEW

[view not recorded (1 of 2)]
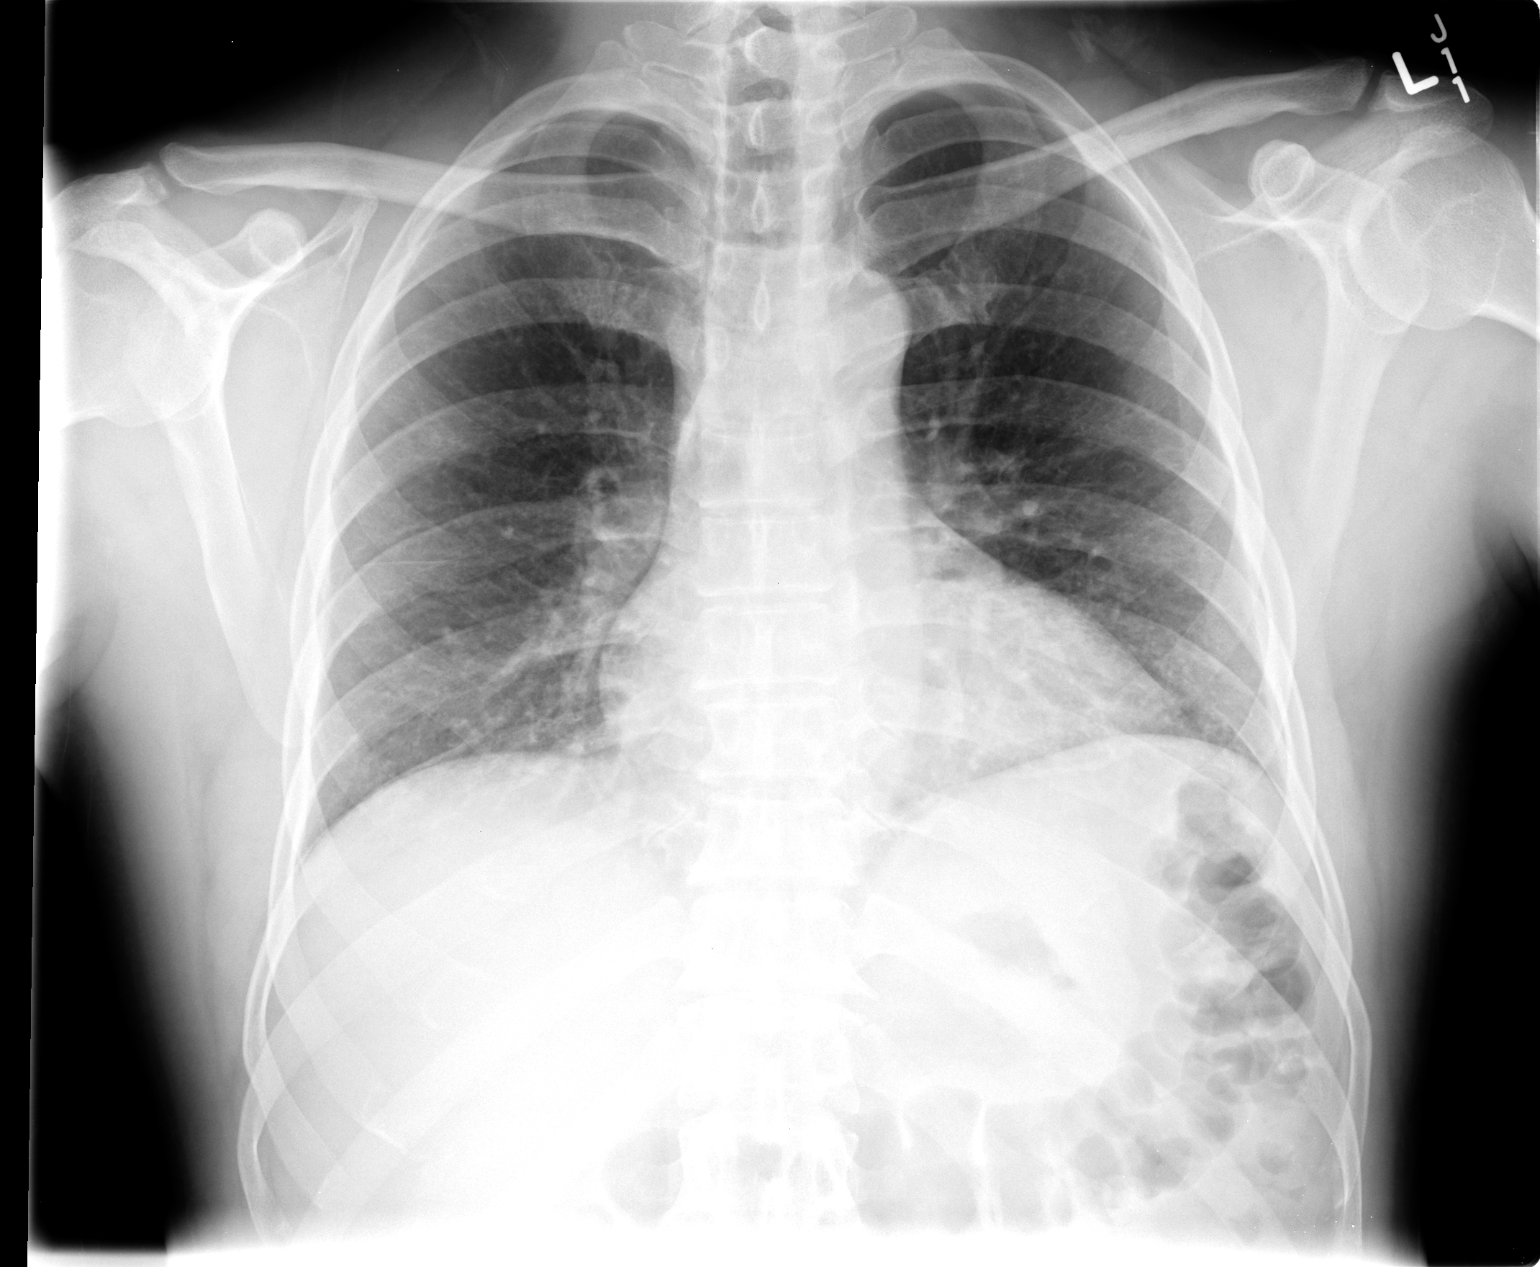

[view not recorded (2 of 2)]
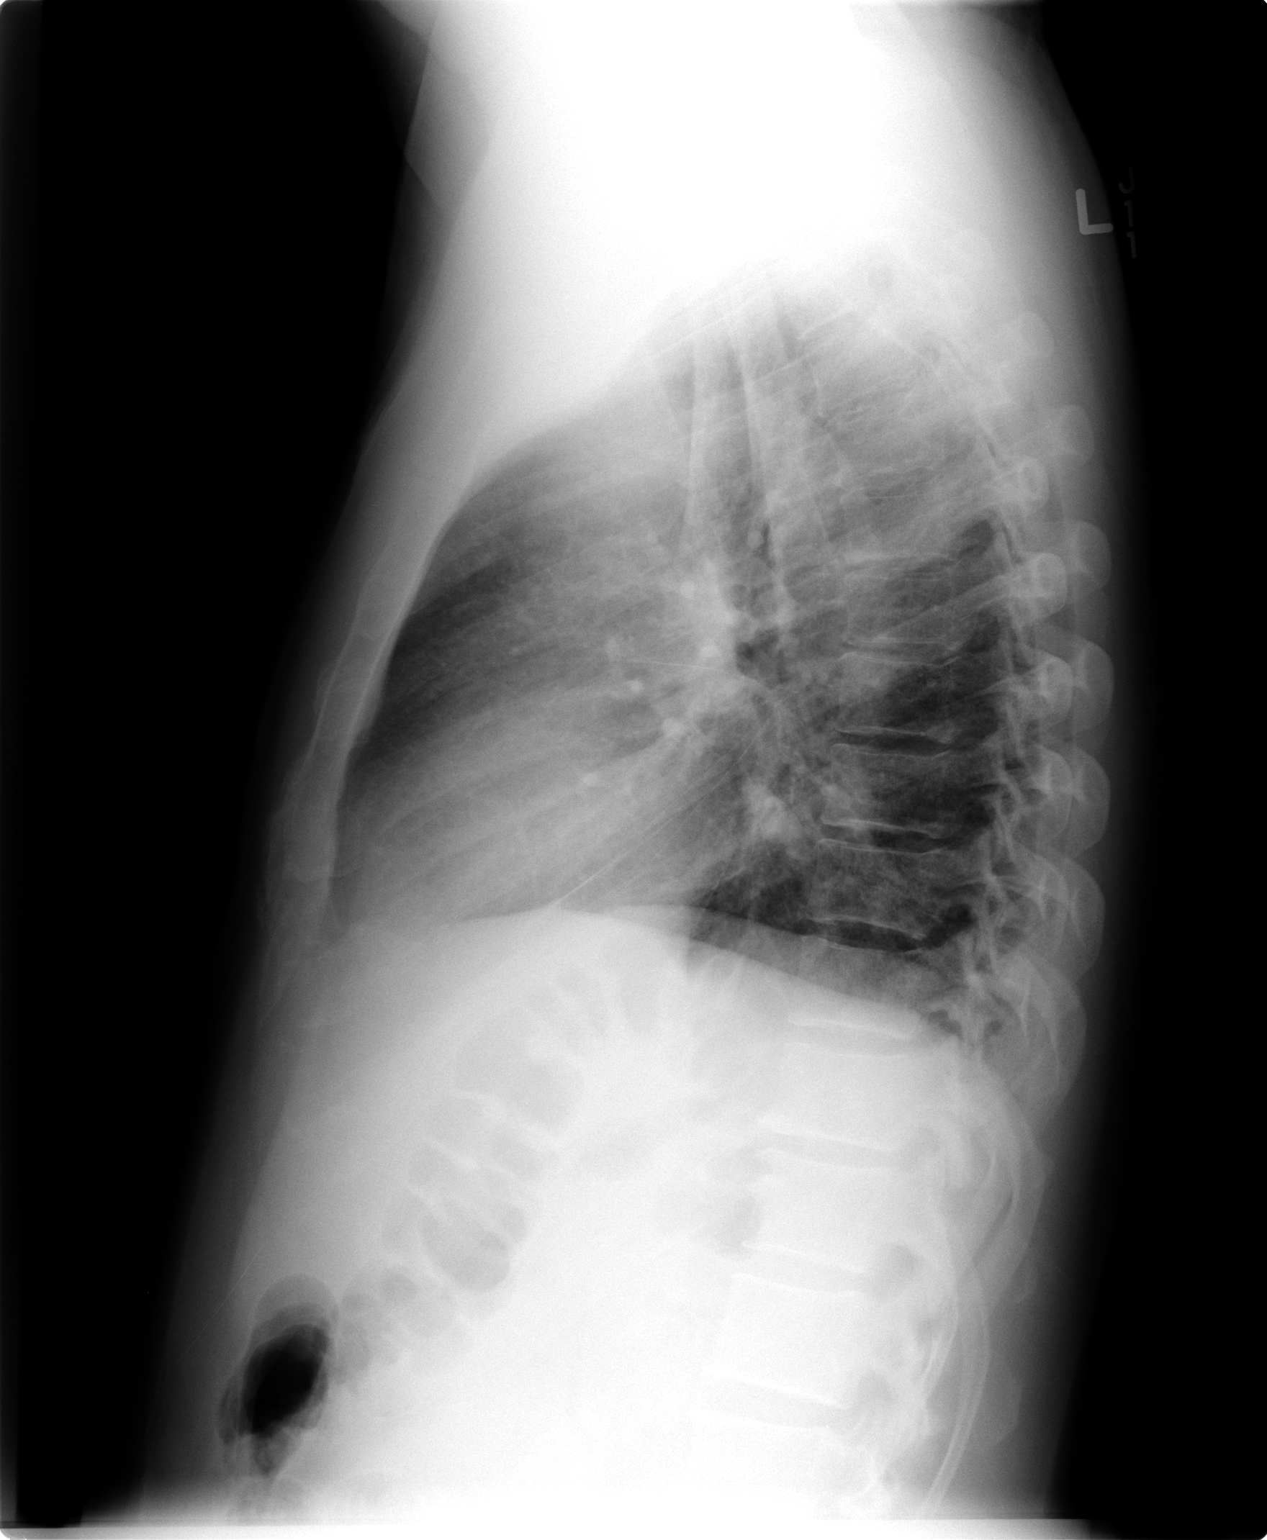

[2 of 2 positions shown; findings below may reference images not displayed]

FINDINGS: Lungs are clear. Heart size and pulmonary vascularity are within
normal limits. No adenopathy. No bone lesions.
IMPRESSION: No abnormality noted.
